# Patient Record
Sex: Male | Born: 1990
Health system: Southern US, Community
[De-identification: ages and names within clinical notes are randomized; demographics above are authoritative.]

## PROBLEM LIST (undated history)

## (undated) DIAGNOSIS — I4819 Other persistent atrial fibrillation: Secondary | ICD-10-CM

## (undated) DIAGNOSIS — F419 Anxiety disorder, unspecified: Secondary | ICD-10-CM

## (undated) HISTORY — DX: Other persistent atrial fibrillation: I48.19

---
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
## 2010-11-14 ENCOUNTER — Encounter: Payer: Self-pay | Admitting: Family Medicine

## 2014-10-27 ENCOUNTER — Emergency Department (HOSPITAL_COMMUNITY): Payer: Self-pay

## 2014-10-27 ENCOUNTER — Emergency Department (HOSPITAL_COMMUNITY)
Admission: EM | Admit: 2014-10-27 | Discharge: 2014-10-27 | Disposition: A | Discharge status: Home / Self Care | Payer: Self-pay | Attending: Emergency Medicine | Admitting: Emergency Medicine

## 2014-10-27 ENCOUNTER — Encounter (HOSPITAL_COMMUNITY): Payer: Self-pay | Admitting: Cardiology

## 2014-10-27 DIAGNOSIS — R000 Tachycardia, unspecified: Secondary | ICD-10-CM | POA: Insufficient documentation

## 2014-10-27 DIAGNOSIS — R0600 Dyspnea, unspecified: Secondary | ICD-10-CM | POA: Insufficient documentation

## 2014-10-27 DIAGNOSIS — R079 Chest pain, unspecified: Secondary | ICD-10-CM

## 2014-10-27 DIAGNOSIS — R42 Dizziness and giddiness: Secondary | ICD-10-CM | POA: Insufficient documentation

## 2014-10-27 DIAGNOSIS — Z87891 Personal history of nicotine dependence: Secondary | ICD-10-CM | POA: Insufficient documentation

## 2014-10-27 DIAGNOSIS — E860 Dehydration: Secondary | ICD-10-CM | POA: Insufficient documentation

## 2014-10-27 LAB — CBC
HCT: 49.5 % (ref 39.0–52.0)
HEMOGLOBIN: 17.4 g/dL — AB (ref 13.0–17.0)
MCH: 31.3 pg (ref 26.0–34.0)
MCHC: 35.2 g/dL (ref 30.0–36.0)
MCV: 89 fL (ref 78.0–100.0)
Platelets: 193 10*3/uL (ref 150–400)
RBC: 5.56 MIL/uL (ref 4.22–5.81)
RDW: 12.1 % (ref 11.5–15.5)
WBC: 8.3 10*3/uL (ref 4.0–10.5)

## 2014-10-27 LAB — BASIC METABOLIC PANEL
ANION GAP: 8 (ref 5–15)
BUN: 17 mg/dL (ref 6–23)
CHLORIDE: 103 meq/L (ref 96–112)
CO2: 27 mmol/L (ref 19–32)
CREATININE: 1.01 mg/dL (ref 0.50–1.35)
Calcium: 9.5 mg/dL (ref 8.4–10.5)
Glucose, Bld: 121 mg/dL — ABNORMAL HIGH (ref 70–99)
Potassium: 3.4 mmol/L — ABNORMAL LOW (ref 3.5–5.1)
Sodium: 138 mmol/L (ref 135–145)

## 2014-10-27 LAB — MAGNESIUM: Magnesium: 1.8 mg/dL (ref 1.5–2.5)

## 2014-10-27 LAB — I-STAT TROPONIN, ED: Troponin i, poc: 0.01 ng/mL (ref 0.00–0.08)

## 2014-10-27 LAB — TSH: TSH: 1.776 u[IU]/mL (ref 0.350–4.500)

## 2014-10-27 LAB — ETHANOL

## 2014-10-27 MED ORDER — SODIUM CHLORIDE 0.9 % IV BOLUS (SEPSIS)
1000.0000 mL | Freq: Once | INTRAVENOUS | Status: AC
  Administered 2014-10-27: 1000 mL via INTRAVENOUS

## 2014-10-27 NOTE — Discharge Instructions (Signed)
As discussed, it is important that you follow up as soon as possible with your physician for continued management of your condition. ° °If you develop any new, or concerning changes in your condition, please return to the emergency department immediately. ° °

## 2014-10-27 NOTE — ED Provider Notes (Signed)
CSN: [id number]     Arrival date & time 10/27/14  1639 History   First MD Initiated Contact with Patient 10/27/14 1714     Chief Complaint  Patient presents with  . Tachycardia     (Consider location/radiation/quality/duration/timing/severity/associated sxs/prior Treatment) HPI Patient presents with concern of palpitations, rapid heart rate. This episode began 3 days ago, since onset has been persistent. Patient is a long history of similar palpitations and tachycardia, though typically episodes resolve spontaneously after several hours or a day. Subsequent has been more pronounced, with more severe palpitations, but with no associated chest pain. There is mild dyspnea and lightheadedness, but no syncope. No neurologic complaints, including no headache, weakness in any extremity. Patient has seen cardiology in the past, has a echocardiogram approximately one year ago, that was abnormal, but seemingly did not include ischemic changes. Since onset no clear alleviating orogastric factors, and no pleuritic or exertional changes.  History reviewed. No pertinent past medical history. History reviewed. No pertinent past surgical history. No family history on file. History  Substance Use Topics  . Smoking status: Former Games developer  . Smokeless tobacco: Not on file  . Alcohol Use: Yes    Review of Systems  Constitutional:       Per HPI, otherwise negative  HENT:       Per HPI, otherwise negative  Respiratory:       Per HPI, otherwise negative  Cardiovascular:       Per HPI, otherwise negative  Gastrointestinal: Negative for vomiting.  Endocrine:       Negative aside from HPI  Genitourinary:       Neg aside from HPI   Musculoskeletal:       Per HPI, otherwise negative  Skin: Negative.   Neurological: Positive for light-headedness. Negative for syncope.      Allergies  Review of patient's allergies indicates no known allergies.  Home Medications   Prior to Admission  medications   Medication Sig Start Date End Date Taking? Authorizing Provider  Multiple Vitamin (MULTIVITAMIN WITH MINERALS) TABS tablet Take 1 tablet by mouth daily.   Yes Historical Provider, MD   BP 109/75 mmHg  Pulse 85  Temp(Src) 98.1 F (36.7 C) (Oral)  Resp 18  SpO2 99% Physical Exam  Constitutional: He is oriented to person, place, and time. He appears well-developed. No distress.  HENT:  Head: Normocephalic and atraumatic.  Eyes: Conjunctivae and EOM are normal.  Cardiovascular: Regular rhythm.  Tachycardia present.   Pulmonary/Chest: Effort normal. No stridor. No respiratory distress.  Abdominal: He exhibits no distension.  Musculoskeletal: He exhibits no edema.  Neurological: He is alert and oriented to person, place, and time.  Skin: Skin is warm and dry.  Psychiatric: He has a normal mood and affect.  Nursing note and vitals reviewed.   ED Course  Procedures (including critical care time) Labs Review Labs Reviewed  CBC - Abnormal; Notable for the following:    Hemoglobin 17.4 (*)    All other components within normal limits  BASIC METABOLIC PANEL - Abnormal; Notable for the following:    Potassium 3.4 (*)    Glucose, Bld 121 (*)    All other components within normal limits  TSH  ETHANOL  MAGNESIUM  T4, FREE  I-STAT TROPOININ, ED    Imaging Review Dg Chest 2 View  10/27/2014   CLINICAL DATA:  Palpitations. Lightheadedness. Shortness of breath. Dizziness for 1 day.  EXAM: CHEST  2 VIEW  COMPARISON:  06/10/2010.  FINDINGS: Normal  sized heart. Clear lungs. Mild central peribronchial thickening. Minimal scoliosis.  IMPRESSION: Mild bronchitic changes with mild progression.   Electronically Signed   By: Gordan Payment M.D.   On: 10/27/2014 17:26     EKG Interpretation   Date/Time:  Monday October 27 2014 16:56:26 EST Ventricular Rate:  153 PR Interval:  112 QRS Duration: 86 QT Interval:  332 QTC Calculation: 530 R Axis:   98 Text Interpretation:  Sinus  tachycardia Rightward axis ST \\T \ Marked  T-wave abnormality, consider inferolateral ischemia Abnormal ECG Sinus  tachycardia Artifact Abnormal ekg Confirmed by Gerhard Munch  MD ([id number])  on 10/27/2014 5:22:34 PM     8:24 PM Patient substantially better in appearance. He denies any cough, substantial dyspnea, or chest discomfort. Heart rate is in the 70s. He will follow-up with cardiology this week. As discussed, it is important that you follow up as soon as possible with your physician for continued management of your condition.  MDM   Final diagnoses:  Chest pain    This patient with a long history of intermittent tachycardia presents with more symptomatically tachycardia, of a longer duration than usual for him. Here the patient is awake and alert, neurologically intact.  Labs are largely reassuring. Initially, the patient had some clinical evidence of dehydration, and given his improvement following 1 L of fluids, this seems like a contributory etiology, given his long history of this, this is unlikely to be the sole cause of his episodes of sinus tachycardia. Patient will follow up with cardiology this week for further evaluation and management.     Gerhard Munch, MD 10/27/14 2024

## 2014-10-27 NOTE — ED Notes (Signed)
Pt reports that over the past couple of days that he has felt like his heart was racing. Also reports SOB, and diaphoresis. States that he has been worked up by a cardiologist for the same and told that he has a abnormality of his heart

## 2014-10-28 LAB — T4, FREE: FREE T4: 1.52 ng/dL (ref 0.80–1.80)

## 2014-10-31 ENCOUNTER — Ambulatory Visit (INDEPENDENT_AMBULATORY_CARE_PROVIDER_SITE_OTHER): Payer: Self-pay | Admitting: Cardiovascular Disease

## 2014-10-31 ENCOUNTER — Encounter: Payer: Self-pay | Admitting: Cardiovascular Disease

## 2014-10-31 VITALS — BP 124/76 | HR 71 | Ht 70.0 in | Wt 130.8 lb

## 2014-10-31 DIAGNOSIS — I471 Supraventricular tachycardia: Secondary | ICD-10-CM

## 2014-10-31 DIAGNOSIS — R0600 Dyspnea, unspecified: Secondary | ICD-10-CM

## 2014-10-31 DIAGNOSIS — R000 Tachycardia, unspecified: Secondary | ICD-10-CM | POA: Insufficient documentation

## 2014-10-31 DIAGNOSIS — R0789 Other chest pain: Secondary | ICD-10-CM

## 2014-10-31 DIAGNOSIS — R079 Chest pain, unspecified: Secondary | ICD-10-CM | POA: Insufficient documentation

## 2014-10-31 MED ORDER — METOPROLOL SUCCINATE ER 25 MG PO TB24: 25.0000 mg | ORAL_TABLET | Freq: Every day | ORAL | Status: DC

## 2014-10-31 NOTE — Assessment & Plan Note (Signed)
Bruce RhodesBetty presents with an episode of tachycardia that occurred several days after he had an acute alcohol binge. I suspect he was still recovering from his alcohol intoxication.  He stated that his heart rate slows very slowly through while in ER. He received IV fluids. I think he still somewhat dehydrated. He is also very anxious. I've encouraged him to eat and drink regularly. We'll have him eat tomato soup and other sibs. We will also have been drinking V8 juice. I'll see him again in one month. We'll give him metoprolol XL 25 mg once a day. He will call if he has any further problems.

## 2014-10-31 NOTE — Progress Notes (Signed)
     Bruce Lopez Date of Birth  [birth year]       [facility]    [place] [facility] [address], [address]  [address], [address] [place], [place]  [id number]   [place], [place]  [id number] [phone]     [phone]   Fax  [phone]     Fax [phone]  Problem List: 1. Tachycardia   History of Present Illness:  Bruce Lopez is a 23 with hx of tachypalpitations.  He has been seen for the past several years by Dr. Jacinto HalimGanji. He has episodes of pre-syncope.  These episodes last for 30 minutes - 1 hour   He was seen in the emergency department on January 4. He had sinus tachycardia versus SVT. Heart rate was 156.  Received IV NS His HR gradually slowed and was normal.  Eats and drinks regularly Drinks on occasion.  He drank quite a lot on  New years eve.  Felt poorly for the next several days and then eventually went to the ER on Monday , Jan. 4.  Feels better today. Still has some palpitations.  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  Used to work as a MetallurgistYouth counselor. Drinks on occasion Non smoker Fhx:  No cardiac hx.  No street drugs.    Current Outpatient Prescriptions on File Prior to Visit  Medication Sig Dispense Refill  . Multiple Vitamin (MULTIVITAMIN WITH MINERALS) TABS tablet Take 1 tablet by mouth daily.     No current facility-administered medications on file prior to visit.    No Known Allergies  No past medical history on file.  No past surgical history on file.  History  Smoking status  . Former Smoker  Smokeless tobacco  . Not on file    History  Alcohol Use  . Yes    No family history on file.  Reviw of Systems:  Reviewed in the HPI.  All other systems are negative.  Physical Exam: Blood pressure 124/76, pulse 71, height 5\' 10"  (1.778 m), weight 130 lb 12.8 oz (59.33 kg), SpO2 98 %. Wt Readings from Last 3 Encounters:  10/31/14 130 lb 12.8 oz (59.33 kg)     General: Well developed, well nourished, in no acute distress.  Head:  Normocephalic, atraumatic, sclera non-icteric, mucus membranes are moist,   Neck: Supple. Carotids are 2 + without bruits. No JVD   Lungs: Clear   Heart: RR, hyperdynamic  Abdomen: Soft, non-tender, non-distended with normal bowel sounds.  Msk:  Strength and tone are normal   Extremities: No clubbing or cyanosis. No edema.  Distal pedal pulses are 2+ and equal    Neuro: CN II - XII intact.  Alert and oriented X 3.   Psych:  Normal   ECG: Jan. 4, 2016:  Sinus tach at 156 Jan. 8, 2016:  NSR at 91, RAD,    Assessment / Plan:

## 2014-10-31 NOTE — Patient Instructions (Signed)
Your physician has recommended you make the following change in your medication:  START Toprol XL 25 mg once daily  Increase your intake of fluids (water with electrolyte tabs like Nun tablets, or gatorade) , protein ( hard boiled eggs, chicken, fish) , and a electrolytes ( V-8 juice, salt, potassium chloride  which is sold as No-Salt  Your physician has requested that you have an echocardiogram. Echocardiography is a painless test that uses sound waves to create images of your heart. It provides your doctor with information about the size and shape of your heart and how well your heart's chambers and valves are working. This procedure takes approximately one hour. There are no restrictions for this procedure.  Your physician recommends that you schedule a follow-up appointment in: 1 month with Dr. Elease HashimotoNahser.

## 2014-11-01 NOTE — Assessment & Plan Note (Signed)
He has atypical CP, Sounds like he may have GERD  Following

## 2014-11-03 ENCOUNTER — Telehealth: Payer: Self-pay | Admitting: Cardiovascular Disease

## 2014-11-03 NOTE — Telephone Encounter (Signed)
Pt signed ROI at front desk at arrival for appointment [facility] & labs a staff member from Dr.ganji office brought these records  Up to our office and these were taken to Michelle/Dr Nahser 10/31/14/km

## 2014-11-04 ENCOUNTER — Ambulatory Visit (HOSPITAL_COMMUNITY)
Admission: RE | Admit: 2014-11-04 | Discharge: 2014-11-04 | Disposition: A | Discharge status: Home / Self Care | Payer: Self-pay | Source: Ambulatory Visit | Attending: Cardiovascular Disease | Admitting: Cardiovascular Disease

## 2014-11-04 ENCOUNTER — Other Ambulatory Visit (HOSPITAL_COMMUNITY): Payer: Self-pay | Admitting: Cardiovascular Disease

## 2014-11-04 DIAGNOSIS — R002 Palpitations: Secondary | ICD-10-CM | POA: Insufficient documentation

## 2014-11-04 DIAGNOSIS — I471 Supraventricular tachycardia: Secondary | ICD-10-CM

## 2014-11-04 DIAGNOSIS — R0600 Dyspnea, unspecified: Secondary | ICD-10-CM

## 2014-11-04 DIAGNOSIS — R000 Tachycardia, unspecified: Secondary | ICD-10-CM

## 2014-11-04 NOTE — Progress Notes (Signed)
2D Echo Performed 11/04/2014    Ellice Boultinghouse, RCS  

## 2014-11-05 ENCOUNTER — Telehealth: Payer: Self-pay | Admitting: Cardiovascular Disease

## 2014-11-05 NOTE — Telephone Encounter (Signed)
I agree with the note by Michelle Swinyer, RN  

## 2014-11-05 NOTE — Telephone Encounter (Signed)
Spoke with patient and reviewed echo results; patient verbalized understanding.  Patient denies history of rheumatic fever - states he remembers something of this nature being questioned when he had previous echocardiogram and his mother states he did not have this in his history.  Patient reports pulse is running in the high 50's; states is this an effect of the Toprol XL.  I advised patient that a slower heart rate is an effect of Toprol and questioned whether he has experienced any light-headedness or dizziness; patient denies.  I advised patient to continue to monitor his HR and BP and to call back if heart rate drops below 50 bpm and/or if patient develops dizziness, light-headedness or any other problem of concern to him.  Patient states he has been feeling better every day since his appointment last week.  I advised him to call if needed prior to follow-up in 3 weeks.

## 2014-11-05 NOTE — Telephone Encounter (Signed)
[facility], pls call [phone]

## 2014-11-17 ENCOUNTER — Ambulatory Visit: Payer: Self-pay | Admitting: Cardiology

## 2014-11-18 ENCOUNTER — Ambulatory Visit: Payer: Self-pay | Admitting: Cardiology

## 2014-12-10 ENCOUNTER — Encounter: Payer: Self-pay | Admitting: Cardiovascular Disease

## 2014-12-10 ENCOUNTER — Ambulatory Visit (INDEPENDENT_AMBULATORY_CARE_PROVIDER_SITE_OTHER): Payer: Self-pay | Admitting: Cardiovascular Disease

## 2014-12-10 DIAGNOSIS — I471 Supraventricular tachycardia: Secondary | ICD-10-CM

## 2014-12-10 DIAGNOSIS — R0789 Other chest pain: Secondary | ICD-10-CM

## 2014-12-10 DIAGNOSIS — R0600 Dyspnea, unspecified: Secondary | ICD-10-CM

## 2014-12-10 DIAGNOSIS — R000 Tachycardia, unspecified: Secondary | ICD-10-CM

## 2014-12-10 MED ORDER — METOPROLOL SUCCINATE ER 25 MG PO TB24: 12.5000 mg | ORAL_TABLET | Freq: Every day | ORAL | Status: DC

## 2014-12-10 NOTE — Patient Instructions (Signed)
Your physician has recommended you make the following change in your medication:  DECREASE Toprol to 12.5 mg once daily  Your physician wants you to follow-up in: 1 year with Dr. Elease HashimotoNahser.  You will receive a reminder letter in the mail two months in advance. If you don't receive a letter, please call our office to schedule the follow-up appointment.

## 2014-12-10 NOTE — Progress Notes (Signed)
Cardiology Office Note   Date:  12/10/2014   ID:  Bruce CuretRandy A Lopez, DOB January 22, 1991, MRN [id number]  PCP:  No PCP Per Patient  Cardiologist:   Vesta MixerNahser, Philip J, MD   Chief Complaint  Patient presents with  . Tachycardia   1. Tachycardia   History of Present Illness:  Bruce Lopez is a 23 with hx of tachypalpitations. He has been seen for the past several years by Dr. Jacinto HalimGanji. He has episodes of pre-syncope.  These episodes last for 30 minutes - 1 hour   He was seen in the emergency department on January 4. He had sinus tachycardia versus SVT. Heart rate was 156. Received IV NS His HR gradually slowed and was normal.  Eats and drinks regularly Drinks on occasion. He drank quite a lot on New years eve. Felt poorly for the next several days and then eventually went to the ER on Monday , Jan. 4.  Feels better today. Still has some palpitations.  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 Used to work as a MetallurgistYouth counselor. Drinks on occasion Non smoker Fhx: No cardiac hx.  No street drugs.   Feb. 17, 2016:   Bruce Lopez is a 24 y.o. male who presents for  Follow-up of his tachycardia. I saw him several months ago. He been drinking lots of alcohol. I advised him to abstain from drinking so much alcohol and we started him on Toprol-XL 25 mg a day.  It took about a week for him to feel more like his usual self.   He's feeling much better since that time.   He has had a few episodes of dizziness when standing     No past medical history on file.  No past surgical history on file.   Current Outpatient Prescriptions  Medication Sig Dispense Refill  . metoprolol succinate (TOPROL XL) 25 MG 24 hr tablet Take 1 tablet (25 mg total) by mouth daily. 31 tablet 11  . Multiple Vitamin (MULTIVITAMIN WITH MINERALS) TABS tablet Take 1 tablet by mouth daily. EVERY OTHER DAY     No current facility-administered medications for this visit.    Allergies:   Review of patient's allergies indicates no known  allergies.    Social History:  The patient  reports that he has quit smoking. He does not have any smokeless tobacco history on file. He reports that he drinks alcohol. He reports that he does not use illicit drugs.   Family History:  The patient's family history is not on file.    ROS:  Please see the history of present illness.    Review of Systems: Constitutional:  denies fever, chills, diaphoresis, appetite change and fatigue.  HEENT: denies photophobia, eye pain, redness, hearing loss, ear pain, congestion, sore throat, rhinorrhea, sneezing, neck pain, neck stiffness and tinnitus.  Respiratory: denies SOB, DOE, cough, chest tightness, and wheezing.  Cardiovascular: denies chest pain, palpitations and leg swelling.  Gastrointestinal: denies nausea, vomiting, abdominal pain, diarrhea, constipation, blood in stool.  Genitourinary: denies dysuria, urgency, frequency, hematuria, flank pain and difficulty urinating.  Musculoskeletal: denies  myalgias, back pain, joint swelling, arthralgias and gait problem.   Skin: denies pallor, rash and wound.  Neurological: denies dizziness, seizures, syncope, weakness, light-headedness, numbness and headaches.   Hematological: denies adenopathy, easy bruising, personal or family bleeding history.  Psychiatric/ Behavioral: denies suicidal ideation, mood changes, confusion, nervousness, sleep disturbance and agitation.       All other systems are reviewed and negative.    PHYSICAL  EXAM: VS:  BP 94/60 mmHg  Pulse 57  Ht  (1.778 m)  Wt 135 lb 6.4 oz (61.417 kg)  BMI 19.43 kg/m2  SpO2 98% , BMI Body mass index is 19.43 kg/(m^2). GEN: Well nourished, well developed, in no acute distress HEENT: normal Neck: no JVD, carotid bruits, or masses Cardiac: RRR; soft systolic 1-2  / 6 systolic murmurs,no edema  Respiratory:  clear to auscultation bilaterally, normal work of breathing GI: soft, nontender, nondistended, + BS MS: no deformity or  atrophy Skin: warm and dry, no rash Neuro:  Strength and sensation are intact Psych: normal   EKG:  EKG is not ordered today.    Recent Labs: 10/27/2014: BUN 17; Creatinine 1.01; Hemoglobin 17.4*; Magnesium 1.8; Platelets 193; Potassium 3.4*; Sodium 138; TSH 1.776    Lipid Panel No results found for: CHOL, TRIG, HDL, CHOLHDL, VLDL, LDLCALC, LDLDIRECT    Wt Readings from Last 3 Encounters:  12/10/14 135 lb 6.4 oz (61.417 kg)  10/31/14 130 lb 12.8 oz (59.33 kg)      Other studies Reviewed: Additional studies/ records that were reviewed today include: . Review of the above records demonstrates:    ASSESSMENT AND PLAN:  1.   Sinus tachycardia: The patient is doing quite a bit better. He's no longer having any episodes of sinus tachycardia and he thinks that his palpitations are much better controlled on the low-dose Toprol. I think most of this is because he has stopped drinking excessive amounts of alcohol.   I've continued to caution him about it drinking excessive amounts about call. He would like to try to cut his Toprol-XL to one half tablet a day. I'll see him again in one year for follow-up visit.   Current medicines are reviewed at length with the patient today.  The patient does not have concerns regarding medicines.  The following changes have been made:  Decrease Toprol  To 12. 5  Mg a day    Disposition:   FU with 1 year    Signed, Nahser, Deloris Ping, MD  12/10/2014 4:08 PM    [facility] [address] [place], [place], [place]  [address] Phone: [phone]; Fax: [phone]

## 2015-04-15 NOTE — Addendum Note (Signed)
Addended by: Levi Aland on: 04/15/2015 05:01 PM   Modules accepted: Orders

## 2016-06-26 ENCOUNTER — Emergency Department (HOSPITAL_COMMUNITY)
Admission: EM | Admit: 2016-06-26 | Discharge: 2016-06-26 | Disposition: A | Discharge status: Home / Self Care | Payer: BLUE CROSS/BLUE SHIELD | Attending: Emergency Medicine | Admitting: Emergency Medicine

## 2016-06-26 ENCOUNTER — Emergency Department (HOSPITAL_COMMUNITY): Payer: BLUE CROSS/BLUE SHIELD

## 2016-06-26 ENCOUNTER — Encounter (HOSPITAL_COMMUNITY): Payer: Self-pay | Admitting: Nurse Practitioner

## 2016-06-26 DIAGNOSIS — R0602 Shortness of breath: Secondary | ICD-10-CM | POA: Diagnosis not present

## 2016-06-26 DIAGNOSIS — Z87891 Personal history of nicotine dependence: Secondary | ICD-10-CM | POA: Insufficient documentation

## 2016-06-26 DIAGNOSIS — R42 Dizziness and giddiness: Secondary | ICD-10-CM

## 2016-06-26 DIAGNOSIS — R200 Anesthesia of skin: Secondary | ICD-10-CM | POA: Diagnosis not present

## 2016-06-26 LAB — I-STAT TROPONIN, ED: Troponin i, poc: 0 ng/mL (ref 0.00–0.08)

## 2016-06-26 LAB — BASIC METABOLIC PANEL
Anion gap: 8 (ref 5–15)
BUN: 17 mg/dL (ref 6–20)
CALCIUM: 10.1 mg/dL (ref 8.9–10.3)
CHLORIDE: 107 mmol/L (ref 101–111)
CO2: 23 mmol/L (ref 22–32)
CREATININE: 0.94 mg/dL (ref 0.61–1.24)
GFR calc Af Amer: >60 mL/min (ref >60)
GFR calc non Af Amer: >60 mL/min (ref >60)
GLUCOSE: 129 mg/dL — AB (ref 65–99)
Potassium: 3 mmol/L — ABNORMAL LOW (ref 3.5–5.1)
Sodium: 138 mmol/L (ref 135–145)

## 2016-06-26 LAB — CBC
HEMATOCRIT: 48.7 % (ref 39.0–52.0)
Hemoglobin: 16.5 g/dL (ref 13.0–17.0)
MCH: 31.3 pg (ref 26.0–34.0)
MCHC: 33.9 g/dL (ref 30.0–36.0)
MCV: 92.4 fL (ref 78.0–100.0)
Platelets: 156 10*3/uL (ref 150–400)
RBC: 5.27 MIL/uL (ref 4.22–5.81)
RDW: 11.8 % (ref 11.5–15.5)
WBC: 7.2 10*3/uL (ref 4.0–10.5)

## 2016-06-26 LAB — D-DIMER, QUANTITATIVE: D-Dimer, Quant: <0.27 ug/mL-FEU (ref 0.00–0.50)

## 2016-06-26 LAB — TSH: TSH: 0.929 u[IU]/mL (ref 0.350–4.500)

## 2016-06-26 MED ORDER — LORAZEPAM 2 MG/ML IJ SOLN
1.0000 mg | Freq: Once | INTRAMUSCULAR | Status: AC
  Administered 2016-06-26: 1 mg via INTRAVENOUS
  Filled 2016-06-26: qty 1

## 2016-06-26 MED ORDER — SODIUM CHLORIDE 0.9 % IV BOLUS (SEPSIS)
1000.0000 mL | Freq: Once | INTRAVENOUS | Status: AC
  Administered 2016-06-26: 1000 mL via INTRAVENOUS

## 2016-06-26 NOTE — ED Triage Notes (Addendum)
He c/o several week history of intermittent SOB, dizziness, numbness in bilateral legs. Symptoms increased with exertion, relieved by nothing. He denies pain, fevers, n/v. He reports cough.he is alert and breathing easily

## 2016-06-26 NOTE — Discharge Instructions (Signed)
Your chest xray, blood work, and lung exam today did not show any acute findings. Please follow up with your family doctor if you are continuing to have shortness of breath.

## 2016-06-26 NOTE — ED Provider Notes (Signed)
[facility]-EMER[facility]ENCY DEPT Provider Note   CSN: [id number] Arrival date & time: 06/26/16  1556     History   Chief Complaint Chief Complaint  Patient presents with  . Shortness of Breath    HPI Bruce Lopez is a 25 y.o. male who presents with SOB, lightheadedness, and numbness in bilateral legs. PMH significant for palpitations and anxiety. Over the past week he reports increasing SOB especially with exertion and exercise. He has not eaten or drank anything today. Today he started to have lightheadedness when walking around and intermittent numbness in his hands and calves. He SOB has gotten worse today therefore came to the ED for further evaluation. He denies fever, chills, syncope, URI symptoms, chest pain, cough, hemoptysis, wheezing, leg swelling, abdominal pain, N/V, recent surgery or travel, exogenous hormone use, hx of DVT/PE. He was seen in the ED on 10/27/2014 for palpitations vs SVT. Was given fluids and was d/c'ed with cardiology follow up. He saw Dr. Elease Hashimoto who put him on low dose Toprol and believed that his symptoms were most likely due to dehydration after heavily drinking alcohol on New Years. Echo on Jan 2016 showed EF 55-60% with mild thickening of the mitral valve consistent with rheumatic heart disease and mild MR. He has been taking Toprol prn for palpitations for the past 6 months. Last dose was ~1.5 weeks ago. He states he is feeling anxious presently due to being in the hospital however has not felt particularly anxious over the past week. Reports he does not smoke.  HPI  Past Medical History:  Diagnosis Date  . Palpitations     Patient Active Problem List   Diagnosis Date Noted  . Sinus tachycardia (HCC) 10/31/2014  . Chest pain 10/31/2014    History reviewed. No pertinent surgical history.     Home Medications    Prior to Admission medications   Medication Sig Start Date End Date Taking? Authorizing Provider  metoprolol succinate (TOPROL XL) 25 MG 24 hr  tablet Take 0.5 tablets (12.5 mg total) by mouth daily. 12/10/14   Vesta Mixer, MD  Multiple Vitamin (MULTIVITAMIN WITH MINERALS) TABS tablet Take 1 tablet by mouth daily. EVERY OTHER DAY    Historical Provider, MD    Family History History reviewed. No pertinent family history.  Social History Social History  Substance Use Topics  . Smoking status: Former Games developer  . Smokeless tobacco: Never Used  . Alcohol use Yes     Allergies   Review of patient's allergies indicates no known allergies.   Review of Systems Review of Systems  Constitutional: Negative for appetite change and fever.  Respiratory: Positive for chest tightness and shortness of breath. Negative for cough, wheezing and stridor.        Throat tightness  Cardiovascular: Negative for chest pain and palpitations.  Gastrointestinal: Negative for abdominal pain, nausea and vomiting.  Musculoskeletal: Negative for back pain and neck pain.  Neurological: Positive for light-headedness and numbness. Negative for dizziness, syncope, weakness and headaches.  Psychiatric/Behavioral: The patient is nervous/anxious.   All other systems reviewed and are negative.    Physical Exam Updated Vital Signs BP 124/89   Pulse 95   Temp 98.9 F (37.2 C) (Oral)   Resp 18   SpO2 100%   Physical Exam  Constitutional: He is oriented to person, place, and time. He appears well-developed and well-nourished. No distress.  Thin, tremulous male. Mild diaphoresis.  HENT:  Head: Normocephalic and atraumatic.  Eyes: Conjunctivae and EOM are normal.  Pupils are equal, round, and reactive to light. Right eye exhibits no discharge. Left eye exhibits no discharge. No scleral icterus.  Neck: Normal range of motion. Neck supple.  No midline tenderness  Cardiovascular: Normal rate and regular rhythm.  Exam reveals no gallop and no friction rub.   No murmur heard. Tachycardia with positional changes. HR at baseline when at rest is ~80-90    Pulmonary/Chest: Effort normal and breath sounds normal. No respiratory distress. He has no wheezes. He has no rales. He exhibits no tenderness.  Abdominal: Soft. He exhibits no distension. There is no tenderness.  Musculoskeletal: He exhibits no edema.  No midline T or L spine tenderness  Neurological: He is alert and oriented to person, place, and time.  Mental Status:  Alert, oriented, thought content appropriate, able to give a coherent history. Speech fluent without evidence of aphasia. Able to follow 2 step commands without difficulty.  Cranial Nerves:  II:  Peripheral visual fields grossly normal, pupils equal, round, reactive to light III,IV, VI: ptosis not present, extra-ocular motions intact bilaterally  V,VII: smile symmetric, facial light touch sensation equal VIII: hearing grossly normal to voice  X: uvula elevates symmetrically  XI: bilateral shoulder shrug symmetric and strong XII: midline tongue extension without fassiculations Motor:  Normal tone. 5/5 in upper and lower extremities bilaterally including strong and equal grip strength and dorsiflexion/plantar flexion Sensory: Pinprick and light touch normal in all extremities.  Deep Tendon Reflexes: 2+ and symmetric in the biceps and patella Cerebellar: normal finger-to-nose with bilateral upper extremities Gait: normal gait and balance CV: distal pulses palpable throughout    Skin: Skin is warm. He is diaphoretic.  Psychiatric: His speech is normal and behavior is normal. Thought content normal. His mood appears anxious.  Nursing note and vitals reviewed.   ED Treatments / Results  Labs (all labs ordered are listed, but only abnormal results are displayed) Labs Reviewed  BASIC METABOLIC PANEL - Abnormal; Notable for the following:       Result Value   Potassium 3.0 (*)    Glucose, Bld 129 (*)    All other components within normal limits  CBC  TSH  D-DIMER, QUANTITATIVE (NOT AT [facility])  Rosezena Sensor, ED     EKG  EKG Interpretation  Date/Time:  Sunday June 26 2016 16:00:22 EDT Ventricular Rate:  121 PR Interval:  122 QRS Duration: 94 QT Interval:  336 QTC Calculation: 477 R Axis:   92 Text Interpretation:  Sinus tachycardia Biatrial enlargement Rightward axis Pulmonary disease pattern ST & T wave abnormality, consider inferolateral ischemia Abnormal ECG No significant change since last tracing Confirmed by [facility] MD, JULIE ([id number]) on 06/26/2016 5:06:48 PM       Radiology Dg Chest 2 View  Result Date: 06/26/2016 CLINICAL DATA:  Shortness of breath with exertion for over week worse today EXAM: CHEST  2 VIEW COMPARISON:  10/27/2014 FINDINGS: Normal heart size, mediastinal contours, and pulmonary vascularity. Minimal chronic bronchitic changes. Lungs otherwise clear. No pleural effusion or pneumothorax. No acute bony abnormalities. IMPRESSION: Minimal chronic bronchitic changes without acute infiltrate. Electronically Signed   By: Ulyses Southward M.D.   On: 06/26/2016 16:19    Procedures Procedures (including critical care time)  Medications Ordered in ED Medications  sodium chloride 0.9 % bolus 1,000 mL (0 mLs Intravenous Stopped 06/26/16 1807)  LORazepam (ATIVAN) injection 1 mg (1 mg Intravenous Given 06/26/16 1718)     Initial Impression / Assessment and Plan / ED Course  I have  reviewed the triage vital signs and the nursing notes.  Pertinent labs & imaging results that were available during my care of the patient were reviewed by me and considered in my medical decision making (see chart for details).  Clinical Course   25 year old male presents with shortness of breath, numbness, lightheadedness. Symptoms are consistent with anxiety with possible dehydration component. He is initially tachycardic into the 120s with positional changes on arrival however at rest he is mostly in 80-90 range. Patient is afebrile, not tachypneic, normotensive, and not hypoxic. Orthostatic BP checked  was normal however pulse was not obtained. CBC is unremarkable. BMP remarkable for mild hypokalemia and mild hyperglycemia. Initial troponin was obtained in triage and was 0. Chest x-ray remarkable for mild chronic bronchitic changes without acute infiltrate. IV fluids and Ativan given.  6:40PM: On recheck patient feels more comfortable and less SOB. TSH and D-dimer are normal. Will d/c with PCP follow up. Patient and mother verbalize understanding. Patient is NAD, non-toxic, with stable VS. Patient is informed of clinical course, understands medical decision making process, and agrees with plan. Opportunity for questions provided and all questions answered. Return precautions given.  Final Clinical Impressions(s) / ED Diagnoses   Final diagnoses:  SOB (shortness of breath)  Extremity numbness  Lightheadedness    New Prescriptions New Prescriptions   No medications on file     Bethel BornKelly Marie Shantanique Hodo, PA-C 06/26/16 1928    Jacalyn LefevreJulie Haviland, MD 06/26/16 2328

## 2016-06-28 ENCOUNTER — Ambulatory Visit (INDEPENDENT_AMBULATORY_CARE_PROVIDER_SITE_OTHER): Payer: Self-pay | Admitting: Physician Assistant

## 2016-06-28 ENCOUNTER — Encounter: Payer: Self-pay | Admitting: Physician Assistant

## 2016-06-28 VITALS — BP 108/70 | HR 59 | Temp 98.6°F | Ht 69.0 in | Wt 131.6 lb

## 2016-06-28 DIAGNOSIS — R0602 Shortness of breath: Secondary | ICD-10-CM

## 2016-06-28 DIAGNOSIS — J302 Other seasonal allergic rhinitis: Secondary | ICD-10-CM | POA: Diagnosis not present

## 2016-06-28 NOTE — Patient Instructions (Addendum)
Zyrtec or claritin or allegra to help with the mucus and for the allergies Do not get the one with the D -   You can try Flonase Over the counter which is a nose spray that may  help   IF you received an x-ray today, you will receive an invoice from [facility]. Please contact [facility] at [phone] with questions or concerns regarding your invoice.   IF you received labwork today, you will receive an invoice from [facility]. Please contact [facility] at [phone] with questions or concerns regarding your invoice.   Our billing staff will not be able to assist you with questions regarding bills from these companies.  You will be contacted with the lab results as soon as they are available. The fastest way to get your results is to activate your My Chart account. Instructions are located on the last page of this paperwork. If you have not heard from us regarding the results in 2 weeks, please contact this office.

## 2016-06-28 NOTE — Progress Notes (Signed)
   [name] A Caterino  MRN: [id number] DOB: 06/22/1991  Subjective:  Pt presents to clinic for hospital f/u from 9/3 visit where he was diagnosed with possible anxiety.  He went to the ED due to SOB that has worsened since then he feels like it is about the same - it is slightly worse with activity - he is having no SOB - he does have some nasal congestion that he thinks might be allergies but no real cold symptoms.  He has never had asthma and never had wheezing.  He feels like he has a take a deeper breath than normal to feel that he is breathing normal.  He can do everything he wants to do -- he is not having trouble talking without problems.  [facility] - works a lot outside  smoker but less than 1/2 pack per day  He has h/o anxiety but he has always had it with irregular HR - he does not feel like he is more stressed than normal andt he thinks it is possible he is having some of that now.  Review of Systems  Respiratory: Positive for shortness of breath. Negative for cough, chest tightness and wheezing.   Cardiovascular: Negative for chest pain and palpitations.  Psychiatric/Behavioral: The patient is nervous/anxious (stress).     Patient Active Problem List   Diagnosis Date Noted  . Sinus tachycardia (HCC) 10/31/2014  . Chest pain 10/31/2014    Current Outpatient Prescriptions on File Prior to Visit  Medication Sig Dispense Refill  . metoprolol succinate (TOPROL XL) 25 MG 24 hr tablet Take 0.5 tablets (12.5 mg total) by mouth daily. 31 tablet 11  . Multiple Vitamin (MULTIVITAMIN WITH MINERALS) TABS tablet Take 1 tablet by mouth daily. EVERY OTHER DAY     No current facility-administered medications on file prior to visit.     No Known Allergies  Pt patients past, family and social history were reviewed and updated.  Objective:  BP 108/70 (BP Location: Right Arm, Patient Position: Sitting, Cuff Size: Normal)   Pulse (!)  59   Temp 98.6 F (37 C) (Oral)   Ht 5\' 9"  (1.753 m)   Wt 131 lb 9.6 oz (59.7 kg)   SpO2 99%   BMI 19.43 kg/m   Physical Exam  Constitutional: He is oriented to person, place, and time and well-developed, well-nourished, and in no distress.  HENT:  Head: Normocephalic and atraumatic.  Right Ear: External ear normal.  Left Ear: External ear normal.  Eyes: Conjunctivae are normal.  Neck: Normal range of motion.  Cardiovascular: Normal rate, regular rhythm and normal heart sounds.   No murmur heard. Pulmonary/Chest: Effort normal and breath sounds normal. He has no wheezes.  Neurological: He is alert and oriented to person, place, and time. Gait normal.  Skin: Skin is warm and dry.  Psychiatric: Mood, memory, affect and judgment normal.    Assessment and Plan :  SOB (shortness of breath)  Seasonal allergies   Expect this to not be cardiac or lung related -- d/w pt that this may be multifactorial in regards to anxiety and possible nasal congestion with allergies.  He will treat his allergies with OTC allergy medications and he will continue to monitor his stress and stressful situations and how they related to this SOB - he agrees and understands the plan.  Benny LennertSarah Jamacia Jester PA-C  [facility] 06/30/2016 1:58 PM

## 2017-05-26 ENCOUNTER — Telehealth: Payer: Self-pay | Admitting: Cardiovascular Disease

## 2017-05-26 ENCOUNTER — Telehealth: Payer: Self-pay | Admitting: Cardiology

## 2017-05-26 NOTE — Telephone Encounter (Signed)
New message   Pt states that he has started having palpitations again, and he has taken metoprolol and it hasnt really helped.   Patient c/o Palpitations:  High priority if patient c/o lightheadedness and shortness of breath.  1. How long have you been having palpitations? About a week  2. Are you currently experiencing lightheadedness and shortness of breath? Some SOB-especially with strenuous activities  3. Have you checked your BP and heart rate? (document readings) no   4. Are you experiencing any other symptoms? no

## 2017-05-26 NOTE — Telephone Encounter (Signed)
Called patient to discuss.  He plans to go to urgent care this evening for rapid heart beat, irregular feeing much of the time over past 1-2 weeks.  This worsens with exertional activities and he can become SOB during these times.  He reports has been staying well hydrated.  Has cut out caffeine and alcohol.  Used an albuterol inhaler a day before this occurred, but not since.  No new medications.  Unable to check BP and HR at home, part of why he wants to go to urgent care.    I have asked him to call back on Monday to update how he is doing and what happened at his urgent care appointment and at that time if needed we will schedule him back in cardiology.  Pt is in agreement with this plan.

## 2017-05-26 NOTE — Telephone Encounter (Signed)
I received a call [facility] [place] who states that he is seeing the patient for palpitations and an EKG showed atrial fibrillation at 72 bpm. The patient is stable. He has been taking his Toprol only as needed for fast heart beat. Homero FellersFrank does not think the patient needs to go to the hospital since his rate is controlled and he is stable with no chest pain or shortness of breath. He wanted some direction for treatment. I advised to have pt take his Toprol 12.5 mg daily until seen at our office and start anticoagulation with Xarelto 20 mg daily. Kidney function has been normal in the past. I will send a message to triage to bring pt in on Monday to be seen.  I advised him to instruct pt to proceed to the hospital if he becomes symptomatic. He agrees.   Berton BonJanine Decarlo Rivet, AGNP-C [facility] 05/26/2017  7:58 PM

## 2017-05-27 NOTE — Telephone Encounter (Signed)
Agree with note by Lizabeth LeydenNina Hammond, NP  Will add scheduled metoprolol and Xarelto  Will have him seen in the office in a week or so

## 2017-05-29 ENCOUNTER — Telehealth: Payer: Self-pay | Admitting: *Deleted

## 2017-05-29 ENCOUNTER — Encounter (HOSPITAL_COMMUNITY): Payer: Self-pay | Admitting: Nurse Practitioner

## 2017-05-29 ENCOUNTER — Ambulatory Visit (HOSPITAL_COMMUNITY)
Admission: RE | Admit: 2017-05-29 | Discharge: 2017-05-29 | Disposition: A | Discharge status: Home / Self Care | Payer: BLUE CROSS/BLUE SHIELD | Source: Ambulatory Visit | Attending: Nurse Practitioner | Admitting: Nurse Practitioner

## 2017-05-29 VITALS — BP 112/74 | HR 95 | Ht 69.0 in | Wt 133.4 lb

## 2017-05-29 DIAGNOSIS — I481 Persistent atrial fibrillation: Secondary | ICD-10-CM | POA: Diagnosis not present

## 2017-05-29 DIAGNOSIS — I480 Paroxysmal atrial fibrillation: Secondary | ICD-10-CM

## 2017-05-29 DIAGNOSIS — F17200 Nicotine dependence, unspecified, uncomplicated: Secondary | ICD-10-CM | POA: Diagnosis not present

## 2017-05-29 DIAGNOSIS — Z79899 Other long term (current) drug therapy: Secondary | ICD-10-CM | POA: Diagnosis not present

## 2017-05-29 DIAGNOSIS — Z7901 Long term (current) use of anticoagulants: Secondary | ICD-10-CM | POA: Diagnosis not present

## 2017-05-29 DIAGNOSIS — R002 Palpitations: Secondary | ICD-10-CM | POA: Diagnosis present

## 2017-05-29 LAB — CBC
HCT: 49.6 % (ref 39.0–52.0)
Hemoglobin: 16.9 g/dL (ref 13.0–17.0)
MCH: 31.2 pg (ref 26.0–34.0)
MCHC: 34.1 g/dL (ref 30.0–36.0)
MCV: 91.5 fL (ref 78.0–100.0)
PLATELETS: 162 10*3/uL (ref 150–400)
RBC: 5.42 MIL/uL (ref 4.22–5.81)
RDW: 12.4 % (ref 11.5–15.5)
WBC: 5.7 10*3/uL (ref 4.0–10.5)

## 2017-05-29 LAB — BASIC METABOLIC PANEL
Anion gap: 7 (ref 5–15)
BUN: 20 mg/dL (ref 6–20)
CALCIUM: 9.9 mg/dL (ref 8.9–10.3)
CO2: 28 mmol/L (ref 22–32)
CREATININE: 0.96 mg/dL (ref 0.61–1.24)
Chloride: 104 mmol/L (ref 101–111)
GFR calc Af Amer: >60 mL/min (ref >60)
GLUCOSE: 90 mg/dL (ref 65–99)
POTASSIUM: 4.7 mmol/L (ref 3.5–5.1)
Sodium: 139 mmol/L (ref 135–145)

## 2017-05-29 LAB — TSH: TSH: 1.379 u[IU]/mL (ref 0.350–4.500)

## 2017-05-29 MED ORDER — RIVAROXABAN 20 MG PO TABS: 20.0000 mg | ORAL_TABLET | Freq: Every day | ORAL | Status: DC

## 2017-05-29 NOTE — Telephone Encounter (Signed)
Pt has been scheduled in At[facility] Fibrillation Clinic today at 1:30 PM, code is 7001 for August.  LMTCB for pt to let him know about this appointment.

## 2017-05-29 NOTE — Telephone Encounter (Signed)
Pt aware of location and appointment time in A Fib Clinic today

## 2017-05-29 NOTE — Telephone Encounter (Signed)
F/u call:  Patient called to back to confirm appt at the AFIB Clinic and states that if you have any additional information, he will have his phone beside him.

## 2017-05-29 NOTE — Progress Notes (Signed)
Primary Care Physician: Patient, No Pcp Per Referring Physician: Dr. Kasandra Lopez is a 26 y.o. male with a h/o SVT, followed by Dr. Elease Hashimoto, that was seen in an urgent care clinic for palpitations, found to be in rate controlled afib. He denies any use of caffeine, alcohol, drugs, no sleep apnea. Possibly has not slept as well for the last couple of weeks. He felt he was out of rhythm for a week before he went to urgent care. He has EKG with him that very clearly documents afib. He is in rate controlled afib today as well, HR in the 90's. Did not take BB today yet. Works as a Metallurgist. Did use  inhaler the night before he noted afib.  Today, he denies symptoms of palpitations, chest pain, shortness of breath, orthopnea, PND, lower extremity edema, dizziness, presyncope, syncope, or neurologic sequela. The patient is tolerating medications without difficulties and is otherwise without complaint today.   Past Medical History:  Diagnosis Date  . Palpitations    No past surgical history on file.  Current Outpatient Prescriptions  Medication Sig Dispense Refill  . Lysine HCl (L-FORMULA LYSINE HCL) 500 MG TABS Take 1 tablet by mouth daily.    . metoprolol succinate (TOPROL XL) 25 MG 24 hr tablet Take 0.5 tablets (12.5 mg total) by mouth daily. 31 tablet 11  . Multiple Vitamin (MULTIVITAMIN WITH MINERALS) TABS tablet Take 1 tablet by mouth daily. EVERY OTHER DAY    . rivaroxaban (XARELTO) 20 MG TABS tablet Take 1 tablet (20 mg total) by mouth daily with supper. 30 tablet    No current facility-administered medications for this encounter.     No Known Allergies  Social History   Social History  . Marital status: Single    Spouse name: N/A  . Number of children: N/A  . Years of education: N/A   Occupational History  . Not on file.   Social History Main Topics  . Smoking status: Current Some Day Smoker  . Smokeless tobacco: Never Used  . Alcohol use Yes  . Drug use:  No  . Sexual activity: Not on file   Other Topics Concern  . Not on file   Social History Narrative  . No narrative on file    No family history on file.  ROS- All systems are reviewed and negative except as per the HPI above  Physical Exam: Vitals:   05/29/17 1337  BP: 112/74  Pulse: 95  Weight: 133 lb 6.4 oz (60.5 kg)  Height: 5\' 9"  (1.753 m)   Wt Readings from Last 3 Encounters:  05/29/17 133 lb 6.4 oz (60.5 kg)  06/28/16 131 lb 9.6 oz (59.7 kg)  12/10/14 135 lb 6.4 oz (61.4 kg)    Labs: Lab Results  Component Value Date   NA 139 05/29/2017   K 4.7 05/29/2017   CL 104 05/29/2017   CO2 28 05/29/2017   GLUCOSE 90 05/29/2017   BUN 20 05/29/2017   CREATININE 0.96 05/29/2017   CALCIUM 9.9 05/29/2017   MG 1.8 10/27/2014   No results found for: INR No results found for: CHOL, HDL, LDLCALC, TRIG   GEN- The patient is well appearing, alert and oriented x 3 today.   Head- normocephalic, atraumatic Eyes-  Sclera clear, conjunctiva pink Ears- hearing intact Oropharynx- clear Neck- supple, no JVP Lymph- no cervical lymphadenopathy Lungs- Clear to ausculation bilaterally, normal work of breathing Heart- irregular rate and rhythm, no murmurs, rubs or  gallops, PMI not laterally displaced GI- soft, NT, ND, + BS Extremities- no clubbing, cyanosis, or edema MS- no significant deformity or atrophy Skin- no rash or lesion Psych- euthymic mood, full affect Neuro- strength and sensation are intact  EKG-afib at 95 bpm, qrs int 96 ms, qtc 414 ms Ekg from urgent care also reviewed Echo Study Conclusions  - Left ventricle: The cavity size was normal. Wall thickness was normal. Systolic function was normal. The estimated ejection fraction was in the range of 55% to 60%. Wall motion was normal; there were no regional wall motion abnormalities. Left ventricular diastolic function parameters were normal. - Mitral valve: Mild thickening, consistent with rheumatic  disease. There was mild regurgitation.  Impressions:  - Normal LV function; mitral valve with mild rheumatic appearance of anterior MV leaflet; mild MR.   Assessment and Plan: 1.Persistent afib, rate controlled General education re afib  He will continue to take toprol 12.5 mg daily He has a chadsvasc score of 0, will continue xarelto 20 mg daily for now in case cardioversion is neccessary  Bleeding precautions discussed Cannot identify any lifestyle strong triggers for afib Echo 2016 showed rheumatic appearing mitral value and will update this Cbc, cmet, tsh  F/u in 2-3 weeks  Bruce Lopez, ANP-C [facility] [address] [place], [place] [id number] [phone]

## 2017-05-29 NOTE — Telephone Encounter (Signed)
-----   Message from Berton BonJanine Hammond, NP sent at 05/26/2017  8:00 PM EDT ----- Regarding: Needs to be seen Monday This patient was seen in Urgent care on Friday night with possible atrial fib. He needs to be seen on Monday. Please call him to have him come in.  He is a pt of Dr. Elease HashimotoNahser.   Thanks, Berton BonJanine Hammond, AGNP-C [facility] 05/26/2017  8:02 PM

## 2017-06-05 ENCOUNTER — Telehealth (HOSPITAL_COMMUNITY): Payer: Self-pay | Admitting: *Deleted

## 2017-06-05 NOTE — Telephone Encounter (Signed)
Pt called in stating not feeling well, seems to be more short of breath and fatigued. Patient reports taking the metoprolol twice a day although instructions are for once a day and his BP is running of 100/80. Instructed pt to try only taking metoprolol 1/2 tablet at bedtime. Drink couple glasses of water today to bring BP up. Offered pt appt for today to be assessed but patient stated he would wait for now. Will call if issues continue.

## 2017-06-06 ENCOUNTER — Ambulatory Visit (HOSPITAL_COMMUNITY)
Admission: RE | Admit: 2017-06-06 | Discharge: 2017-06-06 | Disposition: A | Discharge status: Home / Self Care | Payer: BLUE CROSS/BLUE SHIELD | Source: Ambulatory Visit | Attending: Nurse Practitioner | Admitting: Nurse Practitioner

## 2017-06-06 DIAGNOSIS — I340 Nonrheumatic mitral (valve) insufficiency: Secondary | ICD-10-CM | POA: Diagnosis not present

## 2017-06-06 DIAGNOSIS — I480 Paroxysmal atrial fibrillation: Secondary | ICD-10-CM | POA: Diagnosis not present

## 2017-06-06 DIAGNOSIS — I4891 Unspecified atrial fibrillation: Secondary | ICD-10-CM | POA: Diagnosis present

## 2017-06-06 NOTE — Progress Notes (Signed)
  Echocardiogram 2D Echocardiogram has been performed.  Bruce Lopez 06/06/2017, 12:38 PM

## 2017-06-12 ENCOUNTER — Ambulatory Visit (HOSPITAL_COMMUNITY)
Admission: RE | Admit: 2017-06-12 | Discharge: 2017-06-12 | Disposition: A | Discharge status: Home / Self Care | Payer: BLUE CROSS/BLUE SHIELD | Source: Ambulatory Visit | Attending: Nurse Practitioner | Admitting: Nurse Practitioner

## 2017-06-12 ENCOUNTER — Other Ambulatory Visit (HOSPITAL_COMMUNITY): Payer: Self-pay | Admitting: Nurse Practitioner

## 2017-06-12 VITALS — BP 106/58 | HR 84 | Ht 69.0 in | Wt 134.0 lb

## 2017-06-12 DIAGNOSIS — I4819 Other persistent atrial fibrillation: Secondary | ICD-10-CM

## 2017-06-12 DIAGNOSIS — I471 Supraventricular tachycardia: Secondary | ICD-10-CM | POA: Insufficient documentation

## 2017-06-12 DIAGNOSIS — I481 Persistent atrial fibrillation: Secondary | ICD-10-CM | POA: Diagnosis not present

## 2017-06-12 DIAGNOSIS — Z7901 Long term (current) use of anticoagulants: Secondary | ICD-10-CM | POA: Diagnosis not present

## 2017-06-12 DIAGNOSIS — F17200 Nicotine dependence, unspecified, uncomplicated: Secondary | ICD-10-CM | POA: Diagnosis not present

## 2017-06-12 LAB — BASIC METABOLIC PANEL
Anion gap: 7 (ref 5–15)
BUN: 17 mg/dL (ref 6–20)
CO2: 28 mmol/L (ref 22–32)
Calcium: 9.9 mg/dL (ref 8.9–10.3)
Chloride: 104 mmol/L (ref 101–111)
Creatinine, Ser: 0.95 mg/dL (ref 0.61–1.24)
Glucose, Bld: 98 mg/dL (ref 65–99)
POTASSIUM: 4.4 mmol/L (ref 3.5–5.1)
SODIUM: 139 mmol/L (ref 135–145)

## 2017-06-12 LAB — CBC
HEMATOCRIT: 48.3 % (ref 39.0–52.0)
Hemoglobin: 16.8 g/dL (ref 13.0–17.0)
MCH: 31.7 pg (ref 26.0–34.0)
MCHC: 34.8 g/dL (ref 30.0–36.0)
MCV: 91.1 fL (ref 78.0–100.0)
Platelets: 162 10*3/uL (ref 150–400)
RBC: 5.3 MIL/uL (ref 4.22–5.81)
RDW: 12.2 % (ref 11.5–15.5)
WBC: 6.1 10*3/uL (ref 4.0–10.5)

## 2017-06-12 LAB — MAGNESIUM: Magnesium: 2.2 mg/dL (ref 1.7–2.4)

## 2017-06-12 MED ORDER — RIVAROXABAN 20 MG PO TABS: 20.0000 mg | ORAL_TABLET | Freq: Every day | ORAL | 0 refills | Status: DC

## 2017-06-12 MED ORDER — METOPROLOL SUCCINATE ER 25 MG PO TB24: 12.5000 mg | ORAL_TABLET | Freq: Every day | ORAL | 2 refills | Status: DC

## 2017-06-12 NOTE — Progress Notes (Signed)
Primary Care Physician: Patient, No Pcp Per Referring Physician: Dr. Kasandra Knudsen is a 26 y.o. male with a h/o SVT, followed by Dr. Elease Hashimoto, that was seen in an urgent care clinic for palpitations, found to be in rate controlled afib. He denies any use of caffeine, alcohol, drugs, no sleep apnea. Possibly has not slept as well for the last couple of weeks. He felt he was out of rhythm for a week before he went to urgent care. He has EKG with him that very clearly documents afib. He is in rate controlled afib today as well, HR in the 90's. Did not take BB today yet. Works as a Metallurgist. Did use  inhaler the night before he noted afib.States he is not drinking any alcohol.Dr. Melburn Popper started xarelto 20 mg a day on 8/6 and restarted metoprolol succinate 25 mg 1/2 tab a day.  On last visit, we tried to increase toprol to 25 mg 1/2 bid, to help restore SR,but he did not tolerate due to fatigue.  Unfortunately,f/u in afib clinic 8/20, he has not converted to SR over the last few weeks. He will have been on anticoagulation on 8/27 without any missed doses. Will plan on cardioversion 8/30 with Dr. Elease Hashimoto.Feels that he is tolerating afib a little better lately.Still no know triggers. Repeat echo showed rheumatic appearing valve, stable compared to last echo. He denies having rheumatic fever.  Today, he denies symptoms of palpitations, chest pain, shortness of breath, orthopnea, PND, lower extremity edema, dizziness, presyncope, syncope, or neurologic sequela. The patient is tolerating medications without difficulties and is otherwise without complaint today.   Past Medical History:  Diagnosis Date  . Palpitations    No past surgical history on file.  Current Outpatient Prescriptions  Medication Sig Dispense Refill  . Lysine HCl (L-FORMULA LYSINE HCL) 500 MG TABS Take 1 tablet by mouth daily.    . metoprolol succinate (TOPROL XL) 25 MG 24 hr tablet Take 0.5 tablets (12.5 mg total) by  mouth daily. 31 tablet 11  . Multiple Vitamin (MULTIVITAMIN WITH MINERALS) TABS tablet Take 1 tablet by mouth daily. EVERY OTHER DAY    . rivaroxaban (XARELTO) 20 MG TABS tablet Take 1 tablet (20 mg total) by mouth daily with supper. 30 tablet    No current facility-administered medications for this encounter.     No Known Allergies  Social History   Social History  . Marital status: Single    Spouse name: N/A  . Number of children: N/A  . Years of education: N/A   Occupational History  . Not on file.   Social History Main Topics  . Smoking status: Current Some Day Smoker  . Smokeless tobacco: Never Used  . Alcohol use Yes  . Drug use: No  . Sexual activity: Not on file   Other Topics Concern  . Not on file   Social History Narrative  . No narrative on file    No family history on file.  ROS- All systems are reviewed and negative except as per the HPI above  Physical Exam: Vitals:   06/12/17 1548  BP: (!) 106/58  Pulse: 84  Weight: 134 lb (60.8 kg)  Height: 5\' 9"  (1.753 m)   Wt Readings from Last 3 Encounters:  06/12/17 134 lb (60.8 kg)  05/29/17 133 lb 6.4 oz (60.5 kg)  06/28/16 131 lb 9.6 oz (59.7 kg)    Labs: Lab Results  Component Value Date   NA 139  05/29/2017   K 4.7 05/29/2017   CL 104 05/29/2017   CO2 28 05/29/2017   GLUCOSE 90 05/29/2017   BUN 20 05/29/2017   CREATININE 0.96 05/29/2017   CALCIUM 9.9 05/29/2017   MG 1.8 10/27/2014   No results found for: INR No results found for: CHOL, HDL, LDLCALC, TRIG   GEN- The patient is well appearing, alert and oriented x 3 today.   Head- normocephalic, atraumatic Eyes-  Sclera clear, conjunctiva pink Ears- hearing intact Oropharynx- clear Neck- supple, no JVP Lymph- no cervical lymphadenopathy Lungs- Clear to ausculation bilaterally, normal work of breathing Heart- irregular rate and rhythm, no murmurs, rubs or gallops, PMI not laterally displaced GI- soft, NT, ND, + BS Extremities- no  clubbing, cyanosis, or edema MS- no significant deformity or atrophy Skin- no rash or lesion Psych- euthymic mood, full affect Neuro- strength and sensation are intact  EKG-afib at 84 bpm, qrs int 96 ms, qtc 394 ms Ekg from urgent care also reviewed Echo Study Conclusions  - Left ventricle: The cavity size was normal. Systolic function was   normal. The estimated ejection fraction was in the range of 55%   to 60%. Wall motion was normal; there were no regional wall   motion abnormalities. - Aortic valve: Transvalvular velocity was within the normal range.   There was no stenosis. There was no regurgitation. - Mitral valve: Thickening. , consistent with rheumatic disease.   Transvalvular velocity was within the normal range. There was no   evidence for stenosis. There was mild regurgitation. - Right ventricle: The cavity size was normal. Wall thickness was   normal. Systolic function was normal. - Tricuspid valve: There was trivial regurgitation. - Pulmonary arteries: Systolic pressure was within the normal   range.    Assessment and Plan: 1.Persistent afib, rate controlled He will continue to take toprol 12.5 mg daily He has a chadsvasc score of 0, will continue xarelto 20 mg daily, will have been on drug without any missed doses 8/27 Will schedule for cardioversion with Dr. Elease HashimotoNahser 8/30 Risk vrs benefit explained to pt re DCCV Will hold toprol the night before to prevent pt being  slow on return to SR, can resume later that day if no significant brady Will have to continue xarleto for 4 weeks after cardioversion but if in SR, will be able to stop after that with chadsvasc score of 0 Repeat Echo, Stable, continues to show  rheumatic appearing mitral valve, pt denies having Cbc, bmet mag today  F/u in 1 week after cardioversion Will request f/u with Dr. Elease HashimotoNahser in one month  Elvina SidleDonna C. Matthew Folksarroll, ANP-C [facility] [address] [place], [place]  [id number] [phone]

## 2017-06-12 NOTE — Patient Instructions (Addendum)
Your cardioversion is scheduled for : Thursday Aug 30 at 1:00 pm Arrive at the [facility] and go to admitting at 11:30 Do Not eat or drink anything after midnight the night prior to your procedure. Take all your medications with a sip of water prior to arrival. Do NOT miss any doses of your blood thinner. You will NOT be able to drive home after your procedure.   Hold Metoprolol the night before your procedure  Follow up in afib clinic in one week after cardioversion.  This has been scheduled

## 2017-06-22 ENCOUNTER — Ambulatory Visit (HOSPITAL_COMMUNITY): Payer: BLUE CROSS/BLUE SHIELD | Admitting: Anesthesiology

## 2017-06-22 ENCOUNTER — Encounter (HOSPITAL_COMMUNITY): Payer: Self-pay | Admitting: *Deleted

## 2017-06-22 ENCOUNTER — Encounter (HOSPITAL_COMMUNITY)
Admission: RE | Disposition: A | Discharge status: Home / Self Care | Payer: Self-pay | Source: Ambulatory Visit | Attending: Cardiovascular Disease

## 2017-06-22 ENCOUNTER — Ambulatory Visit (HOSPITAL_COMMUNITY)
Admission: RE | Admit: 2017-06-22 | Discharge: 2017-06-22 | Disposition: A | Discharge status: Home / Self Care | Payer: BLUE CROSS/BLUE SHIELD | Source: Ambulatory Visit | Attending: Cardiovascular Disease | Admitting: Cardiovascular Disease

## 2017-06-22 DIAGNOSIS — I481 Persistent atrial fibrillation: Secondary | ICD-10-CM | POA: Insufficient documentation

## 2017-06-22 DIAGNOSIS — Z79899 Other long term (current) drug therapy: Secondary | ICD-10-CM | POA: Insufficient documentation

## 2017-06-22 DIAGNOSIS — Z7901 Long term (current) use of anticoagulants: Secondary | ICD-10-CM | POA: Insufficient documentation

## 2017-06-22 DIAGNOSIS — R002 Palpitations: Secondary | ICD-10-CM | POA: Diagnosis not present

## 2017-06-22 DIAGNOSIS — I4819 Other persistent atrial fibrillation: Secondary | ICD-10-CM

## 2017-06-22 DIAGNOSIS — F17200 Nicotine dependence, unspecified, uncomplicated: Secondary | ICD-10-CM | POA: Diagnosis not present

## 2017-06-22 HISTORY — PX: CARDIOVERSION: SHX1299

## 2017-06-22 SURGERY — CARDIOVERSION
Anesthesia: General

## 2017-06-22 MED ORDER — PROPOFOL 10 MG/ML IV BOLUS
INTRAVENOUS | Status: DC | PRN
  Administered 2017-06-22: 40 mg via INTRAVENOUS
  Administered 2017-06-22: 60 mg via INTRAVENOUS
  Administered 2017-06-22: 40 mg via INTRAVENOUS
  Administered 2017-06-22: 60 mg via INTRAVENOUS

## 2017-06-22 MED ORDER — SODIUM CHLORIDE 0.9 % IV SOLN
INTRAVENOUS | Status: DC | PRN
  Administered 2017-06-22: 13:00:00 via INTRAVENOUS

## 2017-06-22 NOTE — Discharge Instructions (Signed)
Electrical Cardioversion, Care After °This sheet gives you information about how to care for yourself after your procedure. Your health care provider may also give you more specific instructions. If you have problems or questions, contact your health care provider. °What can I expect after the procedure? °After the procedure, it is common to have: °· Some redness on the skin where the shocks were given. ° °Follow these instructions at home: °· Do not drive for 24 hours if you were given a medicine to help you relax (sedative). °· Take over-the-counter and prescription medicines only as told by your health care provider. °· Ask your health care provider how to check your pulse. Check it often. °· Rest for 48 hours after the procedure or as told by your health care provider. °· Avoid or limit your caffeine use as told by your health care provider. °Contact a health care provider if: °· You feel like your heart is beating too quickly or your pulse is not regular. °· You have a serious muscle cramp that does not go away. °Get help right away if: °· You have discomfort in your chest. °· You are dizzy or you feel faint. °· You have trouble breathing or you are short of breath. °· Your speech is slurred. °· You have trouble moving an arm or leg on one side of your body. °· Your fingers or toes turn cold or blue. °This information is not intended to replace advice given to you by your health care provider. Make sure you discuss any questions you have with your health care provider. °Document Released: 07/31/2013 Document Revised: 05/13/2016 Document Reviewed: 04/15/2016 °Elsevier Interactive Patient Education © 2018 [employer] ° °

## 2017-06-22 NOTE — Interval H&P Note (Signed)
History and Physical Interval Note:  06/22/2017 12:47 PM  Bruce Lopez  has presented today for surgery, with the diagnosis of AFIB  The various methods of treatment have been discussed with the patient and family. After consideration of risks, benefits and other options for treatment, the patient has consented to  Procedure(s): CARDIOVERSION (N/A) as a surgical intervention .  The patient's history has been reviewed, patient examined, no change in status, stable for surgery.  I have reviewed the patient's chart and labs.  Questions were answered to the patient's satisfaction.     Kristeen MissPhilip Nahser

## 2017-06-22 NOTE — CV Procedure (Signed)
    Cardioversion Note  [name] A Cassaday [id number] 10/07/1991  Procedure: DC Cardioversion Indications: Atrial fib   Procedure Details Consent: Obtained Time Out: Verified patient identification, verified procedure, site/side was marked, verified correct patient position, special equipment/implants available, Radiology Safety Procedures followed,  medications/allergies/relevent history reviewed, required imaging and test results available.  Performed  The patient has been on adequate anticoagulation.  The patient received IV Lidocaine 50 mg IV followed by Propofol 200 mg IV  for sedation.  Synchronous cardioversion was performed at 75 joules.  The cardioversion was successful     Complications: No apparent complications Patient did tolerate procedure well.   Vesta MixerPhilip J. Jerie Basford, Montez HagemanJr., MD, [facility] 06/22/2017, 1:09 PM

## 2017-06-22 NOTE — H&P (View-Only) (Signed)
 Primary Care Physician: Patient, No Pcp Per Referring Physician: Dr. Nahser   Bruce Lopez is a 26 y.o. male with a h/o SVT, followed by Dr. Nahser, that was seen in an urgent care clinic for palpitations, found to be in rate controlled afib. He denies any use of caffeine, alcohol, drugs, no sleep apnea. Possibly has not slept as well for the last couple of weeks. He felt he was out of rhythm for a week before he went to urgent care. He has EKG with him that very clearly documents afib. He is in rate controlled afib today as well, HR in the 90's. Did not take BB today yet. Works as a youth counselor. Did use  inhaler the night before he noted afib.States he is not drinking any alcohol.Dr. Nasher started xarelto 20 mg a day on 8/6 and restarted metoprolol succinate 25 mg 1/2 tab a day.  On last visit, we tried to increase toprol to 25 mg 1/2 bid, to help restore SR,but he did not tolerate due to fatigue.  Unfortunately,f/u in afib clinic 8/20, he has not converted to SR over the last few weeks. He will have been on anticoagulation on 8/27 without any missed doses. Will plan on cardioversion 8/30 with Dr. Nahser.Feels that he is tolerating afib a little better lately.Still no know triggers. Repeat echo showed rheumatic appearing valve, stable compared to last echo. He denies having rheumatic fever.  Today, he denies symptoms of palpitations, chest pain, shortness of breath, orthopnea, PND, lower extremity edema, dizziness, presyncope, syncope, or neurologic sequela. The patient is tolerating medications without difficulties and is otherwise without complaint today.   Past Medical History:  Diagnosis Date  . Palpitations    No past surgical history on file.  Current Outpatient Prescriptions  Medication Sig Dispense Refill  . Lysine HCl (L-FORMULA LYSINE HCL) 500 MG TABS Take 1 tablet by mouth daily.    . metoprolol succinate (TOPROL XL) 25 MG 24 hr tablet Take 0.5 tablets (12.5 mg total) by  mouth daily. 31 tablet 11  . Multiple Vitamin (MULTIVITAMIN WITH MINERALS) TABS tablet Take 1 tablet by mouth daily. EVERY OTHER DAY    . rivaroxaban (XARELTO) 20 MG TABS tablet Take 1 tablet (20 mg total) by mouth daily with supper. 30 tablet    No current facility-administered medications for this encounter.     No Known Allergies  Social History   Social History  . Marital status: Single    Spouse name: N/A  . Number of children: N/A  . Years of education: N/A   Occupational History  . Not on file.   Social History Main Topics  . Smoking status: Current Some Day Smoker  . Smokeless tobacco: Never Used  . Alcohol use Yes  . Drug use: No  . Sexual activity: Not on file   Other Topics Concern  . Not on file   Social History Narrative  . No narrative on file    No family history on file.  ROS- All systems are reviewed and negative except as per the HPI above  Physical Exam: Vitals:   06/12/17 1548  BP: (!) 106/58  Pulse: 84  Weight: 134 lb (60.8 kg)  Height: 5' 9" (1.753 m)   Wt Readings from Last 3 Encounters:  06/12/17 134 lb (60.8 kg)  05/29/17 133 lb 6.4 oz (60.5 kg)  06/28/16 131 lb 9.6 oz (59.7 kg)    Labs: Lab Results  Component Value Date   NA 139   05/29/2017   K 4.7 05/29/2017   CL 104 05/29/2017   CO2 28 05/29/2017   GLUCOSE 90 05/29/2017   BUN 20 05/29/2017   CREATININE 0.96 05/29/2017   CALCIUM 9.9 05/29/2017   MG 1.8 10/27/2014   No results found for: INR No results found for: CHOL, HDL, LDLCALC, TRIG   GEN- The patient is well appearing, alert and oriented x 3 today.   Head- normocephalic, atraumatic Eyes-  Sclera clear, conjunctiva pink Ears- hearing intact Oropharynx- clear Neck- supple, no JVP Lymph- no cervical lymphadenopathy Lungs- Clear to ausculation bilaterally, normal work of breathing Heart- irregular rate and rhythm, no murmurs, rubs or gallops, PMI not laterally displaced GI- soft, NT, ND, + BS Extremities- no  clubbing, cyanosis, or edema MS- no significant deformity or atrophy Skin- no rash or lesion Psych- euthymic mood, full affect Neuro- strength and sensation are intact  EKG-afib at 84 bpm, qrs int 96 ms, qtc 394 ms Ekg from urgent care also reviewed Echo Study Conclusions  - Left ventricle: The cavity size was normal. Systolic function was   normal. The estimated ejection fraction was in the range of 55%   to 60%. Wall motion was normal; there were no regional wall   motion abnormalities. - Aortic valve: Transvalvular velocity was within the normal range.   There was no stenosis. There was no regurgitation. - Mitral valve: Thickening. , consistent with rheumatic disease.   Transvalvular velocity was within the normal range. There was no   evidence for stenosis. There was mild regurgitation. - Right ventricle: The cavity size was normal. Wall thickness was   normal. Systolic function was normal. - Tricuspid valve: There was trivial regurgitation. - Pulmonary arteries: Systolic pressure was within the normal   range.    Assessment and Plan: 1.Persistent afib, rate controlled He will continue to take toprol 12.5 mg daily He has a chadsvasc score of 0, will continue xarelto 20 mg daily, will have been on drug without any missed doses 8/27 Will schedule for cardioversion with Dr. Elease HashimotoNahser 8/30 Risk vrs benefit explained to pt re DCCV Will hold toprol the night before to prevent pt being  slow on return to SR, can resume later that day if no significant brady Will have to continue xarleto for 4 weeks after cardioversion but if in SR, will be able to stop after that with chadsvasc score of 0 Repeat Echo, Stable, continues to show  rheumatic appearing mitral valve, pt denies having Cbc, bmet mag today  F/u in 1 week after cardioversion Will request f/u with Dr. Elease HashimotoNahser in one month  Elvina SidleDonna C. Matthew Folksarroll, ANP-C [facility] [address] [place], [place]  [id number] [phone]

## 2017-06-22 NOTE — Anesthesia Preprocedure Evaluation (Addendum)
Anesthesia Evaluation  Patient identified by MRN, date of birth, ID band Patient awake    Reviewed: Allergy & Precautions, NPO status , Patient's Chart, lab work & pertinent test results  History of Anesthesia Complications Negative for: history of anesthetic complications  Airway Mallampati: II  TM Distance: >3 FB Neck ROM: Full    Dental no notable dental hx.    Pulmonary neg pulmonary ROS, Current Smoker,    breath sounds clear to auscultation       Cardiovascular + dysrhythmias  Rhythm:Irregular Rate:Tachycardia     Neuro/Psych negative neurological ROS  negative psych ROS   GI/Hepatic negative GI ROS, Neg liver ROS,   Endo/Other  negative endocrine ROS  Renal/GU negative Renal ROS     Musculoskeletal   Abdominal   Peds  Hematology negative hematology ROS (+)   Anesthesia Other Findings Echo 8/18  Study Conclusions  - Left ventricle: The cavity size was normal. Systolic function was   normal. The estimated ejection fraction was in the range of 55%   to 60%. Wall motion was normal; there were no regional wall   motion abnormalities. - Aortic valve: Transvalvular velocity was within the normal range.   There was no stenosis. There was no regurgitation. - Mitral valve: Thickening. , consistent with rheumatic disease.   Transvalvular velocity was within the normal range. There was no   evidence for stenosis. There was mild regurgitation. - Right ventricle: The cavity size was normal. Wall thickness was   normal. Systolic function was normal. - Tricuspid valve: There was trivial regurgitation. - Pulmonary arteries: Systolic pressure was within the normal   range.   Reproductive/Obstetrics                          Anesthesia Physical Anesthesia Plan  ASA: II  Anesthesia Plan: General   Post-op Pain Management:    Induction: Intravenous  PONV Risk Score and Plan: 2 and  Treatment may vary due to age or medical condition  Airway Management Planned: Mask  Additional Equipment: None  Intra-op Plan:   Post-operative Plan:   Informed Consent: I have reviewed the patients History and Physical, chart, labs and discussed the procedure including the risks, benefits and alternatives for the proposed anesthesia with the patient or authorized representative who has indicated his/her understanding and acceptance.     Plan Discussed with: CRNA and Surgeon  Anesthesia Plan Comments: ( )       Anesthesia Quick Evaluation

## 2017-06-22 NOTE — Transfer of Care (Signed)
Immediate Anesthesia Transfer of Care Note  Patient: Bruce Lopez  Procedure(s) Performed: Procedure(s): CARDIOVERSION (N/A)  Patient Location: Endoscopy Unit  Anesthesia Type:General  Level of Consciousness: awake, alert , oriented and patient cooperative  Airway & Oxygen Therapy: Patient Spontanous Breathing and Patient connected to nasal cannula oxygen  Post-op Assessment: Report given to RN, Post -op Vital signs reviewed and stable, Patient moving all extremities and Patient moving all extremities X 4  Post vital signs: Reviewed and stable  Last Vitals:  Vitals:   06/22/17 1150  BP: 134/84  Pulse: (!) 107  Resp: 10  Temp: 36.7 C  SpO2: 100%    Last Pain:  Vitals:   06/22/17 1150  TempSrc: Oral         Complications: No apparent anesthesia complications

## 2017-06-23 ENCOUNTER — Telehealth: Payer: Self-pay | Admitting: Cardiovascular Disease

## 2017-06-23 ENCOUNTER — Encounter (HOSPITAL_COMMUNITY): Payer: Self-pay | Admitting: Cardiovascular Disease

## 2017-06-23 NOTE — Anesthesia Postprocedure Evaluation (Signed)
Anesthesia Post Note  Patient: Bruce Lopez  Procedure(s) Performed: Procedure(s) (LRB): CARDIOVERSION (N/A)     Patient location during evaluation: Endoscopy Anesthesia Type: General Level of consciousness: awake and alert Pain management: pain level controlled Vital Signs Assessment: post-procedure vital signs reviewed and stable Respiratory status: spontaneous breathing, nonlabored ventilation, respiratory function stable and patient connected to nasal cannula oxygen Cardiovascular status: stable Postop Assessment: no signs of nausea or vomiting Anesthetic complications: no    Last Vitals:  Vitals:   06/22/17 1330 06/22/17 1340  BP: 106/78 113/81  Pulse: 73 76  Resp: 16 (!) 0  Temp:    SpO2: 99% 100%    Last Pain:  Vitals:   06/22/17 1310  TempSrc: Oral                 Tziporah Knoke

## 2017-06-23 NOTE — Telephone Encounter (Signed)
Spoke with patient's mother who is with the patient and states he has gone back into a fib with a HR of 75 bpm. I asked her about the mechanism for checking the HR and she states she has used a pulse ox. I asked her to check the pulse manually, which she states she is comfortable with, and she reports HR of 60 bpm. She reports BP is 96/70 mmHg now. I spoke with Dr. Elease HashimotoNahser who is in the office and he advised that patient come in for an appointment with either himself or Rudi Cocoonna Carroll, NP, who ever has soonest appointment. I scheduled patient for Tuesday 9/4 at 11:40 am and advised that per Dr. Elease HashimotoNahser, if HR increases patient may take additional Toprol XL 12.5 mg. Mother states that the patient thinks his symptoms may have gotten worse after taking Toprol XL 12.5 mg last night. I advised that he may hold the Toprol if he feels that this made his symptoms worse. I advised that he should not stop the Xarelto. She verbalized understanding and agreement with plan and thanked me for the call.

## 2017-06-23 NOTE — Telephone Encounter (Signed)
New Message  Patient c/o Palpitations:  High priority if patient c/o lightheadedness and shortness of breath.  1. How long have you been having palpitations? Last night started   2. Are you currently experiencing lightheadedness and shortness of breath? Was sob, per pt mom pt has been laying down today.  3. Have you checked your BP and heart rate? (document readings) no  4. Are you experiencing any other symptoms? No  Per pt mom pt is in afib. Please call back to discuss

## 2017-06-25 ENCOUNTER — Telehealth: Payer: Self-pay | Admitting: Cardiology

## 2017-06-25 NOTE — Telephone Encounter (Signed)
Patent called stating that he ran out of his Xarelto and had a DCCV on 06/22/2017.  Prescription called in for Xarelto 20mg  daily #30 tablets with 1 refill.

## 2017-06-27 ENCOUNTER — Ambulatory Visit (INDEPENDENT_AMBULATORY_CARE_PROVIDER_SITE_OTHER): Payer: BLUE CROSS/BLUE SHIELD | Admitting: Cardiovascular Disease

## 2017-06-27 ENCOUNTER — Encounter: Payer: Self-pay | Admitting: Cardiovascular Disease

## 2017-06-27 ENCOUNTER — Encounter (INDEPENDENT_AMBULATORY_CARE_PROVIDER_SITE_OTHER): Payer: Self-pay

## 2017-06-27 VITALS — BP 108/76 | HR 63 | Ht 69.0 in | Wt 136.8 lb

## 2017-06-27 DIAGNOSIS — I480 Paroxysmal atrial fibrillation: Secondary | ICD-10-CM | POA: Diagnosis not present

## 2017-06-27 NOTE — Progress Notes (Signed)
Cardiology Office Note   Date:  06/27/2017   ID:  Bruce Lopez, DOB 1991-02-11, MRN [id number]  PCP:  Patient, No Pcp Per  Cardiologist:   Kristeen MissPhilip Mikaeel Petrow, MD   Chief Complaint  Patient presents with  . Follow-up    atrial fib,  s/p recent cardioversion   1. Paroxysmal atrial fibrillation   History of Present Illness:  Bruce Lopez is a 26 with hx of tachypalpitations. He has been seen for the past several years by Dr. Jacinto HalimGanji. He has episodes of pre-syncope.  These episodes last for 30 minutes - 1 hour   He was seen in the emergency department on January 4. He had sinus tachycardia versus SVT. Heart rate was 156. Received IV NS His HR gradually slowed and was normal.  Eats and drinks regularly Drinks on occasion. He drank quite a lot on New years eve. Felt poorly for the next several days and then eventually went to the ER on Monday , Jan. 4.  Feels better today. Still has some palpitations.  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 Used to work as a MetallurgistYouth counselor. Drinks on occasion Non smoker Fhx: No cardiac hx.  No street drugs.   Feb. 17, 2016:   Bruce Lopez is a 26 y.o. male who presents for  Follow-up of his tachycardia. I saw him several months ago. He been drinking lots of alcohol. I advised him to abstain from drinking so much alcohol and we started him on Toprol-XL 25 mg a day.  It took about a week for him to feel more like his usual self.   He's feeling much better since that time.   He has had a few episodes of dizziness when standing   Sept. 4, 2018:  Bruce Lopez was seen in the A-fib clinic several weeks ago and was found have atrial fibrillation. He was set up for heart inversion by Rudi Cocoonna Carroll. He had a successful cardioversion on Thursday but called the following day with significant palpitations. Thinks that he was back in atrial fibrillation. He thinks the metoprolol was worsening his paliptations . He stopped the metoprolol and converted back into NSR 3 days later.    Past Medical History:  Diagnosis Date  . Palpitations     Past Surgical History:  Procedure Laterality Date  . CARDIOVERSION N/A 06/22/2017   Procedure: CARDIOVERSION;  Surgeon: Elease HashimotoNahser, Deloris PingPhilip J, MD;  [facility];  Service: Cardiovascular;  Laterality: N/A;     Current Outpatient Prescriptions  Medication Sig Dispense Refill  . Lysine HCl (L-FORMULA LYSINE HCL) 500 MG TABS Take 1 tablet by mouth daily.    . Multiple Vitamin (MULTIVITAMIN WITH MINERALS) TABS tablet Take 1 tablet by mouth daily.     . rivaroxaban (XARELTO) 20 MG TABS tablet Take 1 tablet (20 mg total) by mouth daily with supper. 30 tablet 0   No current facility-administered medications for this visit.     Allergies:   Patient has no known allergies.    Social History:  The patient  reports that he has been smoking.  He has never used smokeless tobacco. He reports that he drinks alcohol. He reports that he does not use drugs.   Family History:  The patient's family history is not on file.    ROS:  Please see the history of present illness.    Review of Systems: Constitutional:  denies fever, chills, diaphoresis, appetite change and fatigue.  HEENT: denies photophobia, eye pain, redness, hearing loss, ear pain, congestion, sore  throat, rhinorrhea, sneezing, neck pain, neck stiffness and tinnitus.  Respiratory: denies SOB, DOE, cough, chest tightness, and wheezing.  Cardiovascular: denies chest pain, palpitations and leg swelling.  Gastrointestinal: denies nausea, vomiting, abdominal pain, diarrhea, constipation, blood in stool.  Genitourinary: denies dysuria, urgency, frequency, hematuria, flank pain and difficulty urinating.  Musculoskeletal: denies  myalgias, back pain, joint swelling, arthralgias and gait problem.   Skin: denies pallor, rash and wound.  Neurological: denies dizziness, seizures, syncope, weakness, light-headedness, numbness and headaches.   Hematological: denies adenopathy, easy  bruising, personal or family bleeding history.  Psychiatric/ Behavioral: denies suicidal ideation, mood changes, confusion, nervousness, sleep disturbance and agitation.       All other systems are reviewed and negative.    PHYSICAL EXAM: VS:  BP 108/76   Pulse 63   Ht 5\' 9"  (1.753 m)   Wt 136 lb 12.8 oz (62.1 kg)   BMI 20.20 kg/m  , BMI Body mass index is 20.2 kg/m. GEN: Well nourished, well developed, in no acute distress  HEENT: normal  Neck: no JVD, carotid bruits, or masses Cardiac: RRR; soft systolic 1-2  / 6 systolic murmurs,no edema  Respiratory:  clear to auscultation bilaterally, normal work of breathing GI: soft, nontender, nondistended, + BS MS: no deformity or atrophy  Skin: warm and dry, no rash Neuro:  Strength and sensation are intact Psych: normal   EKG:  EKG is ordered today. Sinus rhythm at 63.     Recent Labs: 05/29/2017: TSH 1.379 06/12/2017: BUN 17; Creatinine, Ser 0.95; Hemoglobin 16.8; Magnesium 2.2; Platelets 162; Potassium 4.4; Sodium 139    Lipid Panel No results found for: CHOL, TRIG, HDL, CHOLHDL, VLDL, LDLCALC, LDLDIRECT    Wt Readings from Last 3 Encounters:  06/27/17 136 lb 12.8 oz (62.1 kg)  06/12/17 134 lb (60.8 kg)  05/29/17 133 lb 6.4 oz (60.5 kg)     Other studies Reviewed: Additional studies/ records that were reviewed today include: . Review of the above records demonstrates:    ASSESSMENT AND PLAN:  1.   Atrial fibrillation: Mukhtar had a cardioversion last week. He called next day stating that he was having lots of palpitations. He thought that he may be back in atrial fibrillation. ECG today shows NSR . Will DC the metoprolol - he has stopped it anyway and seems to feel better  Off the metoprolol .   Signed, Kristeen Miss, MD  06/27/2017 12:16 PM    [facility] [address] [place], [place], [place]  [address] Phone: [phone]; Fax: [phone]

## 2017-06-27 NOTE — Patient Instructions (Signed)
Medication Instructions:  STOP Metoprolol (Toprol)   Labwork: None Ordered   Testing/Procedures: None Ordered   Follow-Up: Your physician recommends that you schedule a follow-up appointment in: 3 months with Dr. Elease HashimotoNahser   If you need a refill on your cardiac medications before your next appointment, please call your pharmacy.   Thank you for choosing [facility]! Eligha BridegroomMichelle Lakiya Cottam, RN [phone]

## 2017-06-29 ENCOUNTER — Ambulatory Visit (HOSPITAL_COMMUNITY): Payer: BLUE CROSS/BLUE SHIELD | Admitting: Nurse Practitioner

## 2017-07-04 ENCOUNTER — Ambulatory Visit: Payer: BLUE CROSS/BLUE SHIELD | Admitting: Physician Assistant

## 2017-08-11 IMAGING — CR DG CHEST 2V
2 series · 2 of 2 positions shown · non-contrast
Comparison: 10/27/2014

CLINICAL DATA: Shortness of breath with exertion for over week
worse today

EXAM:
CHEST  2 VIEW

[chest pa]
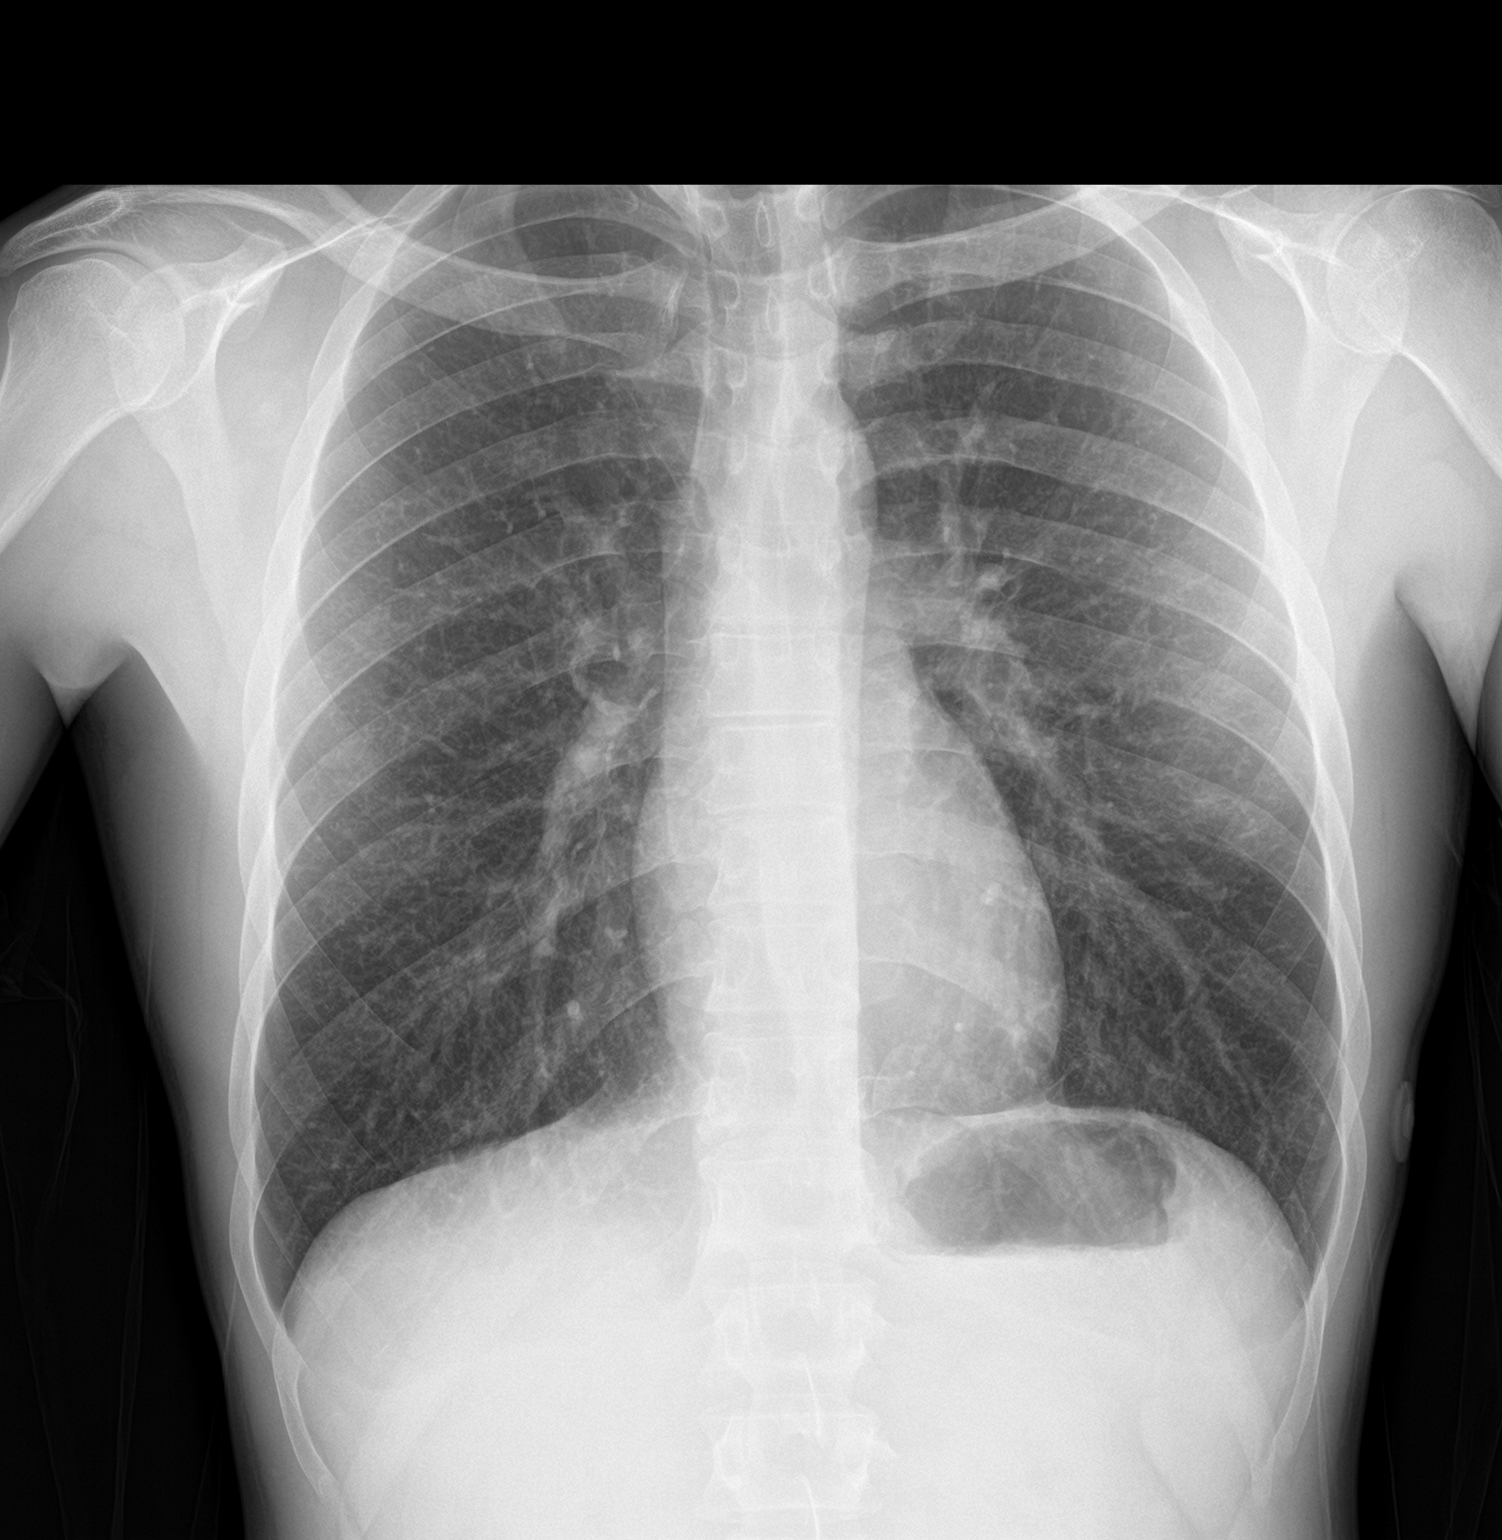

[chest lat]
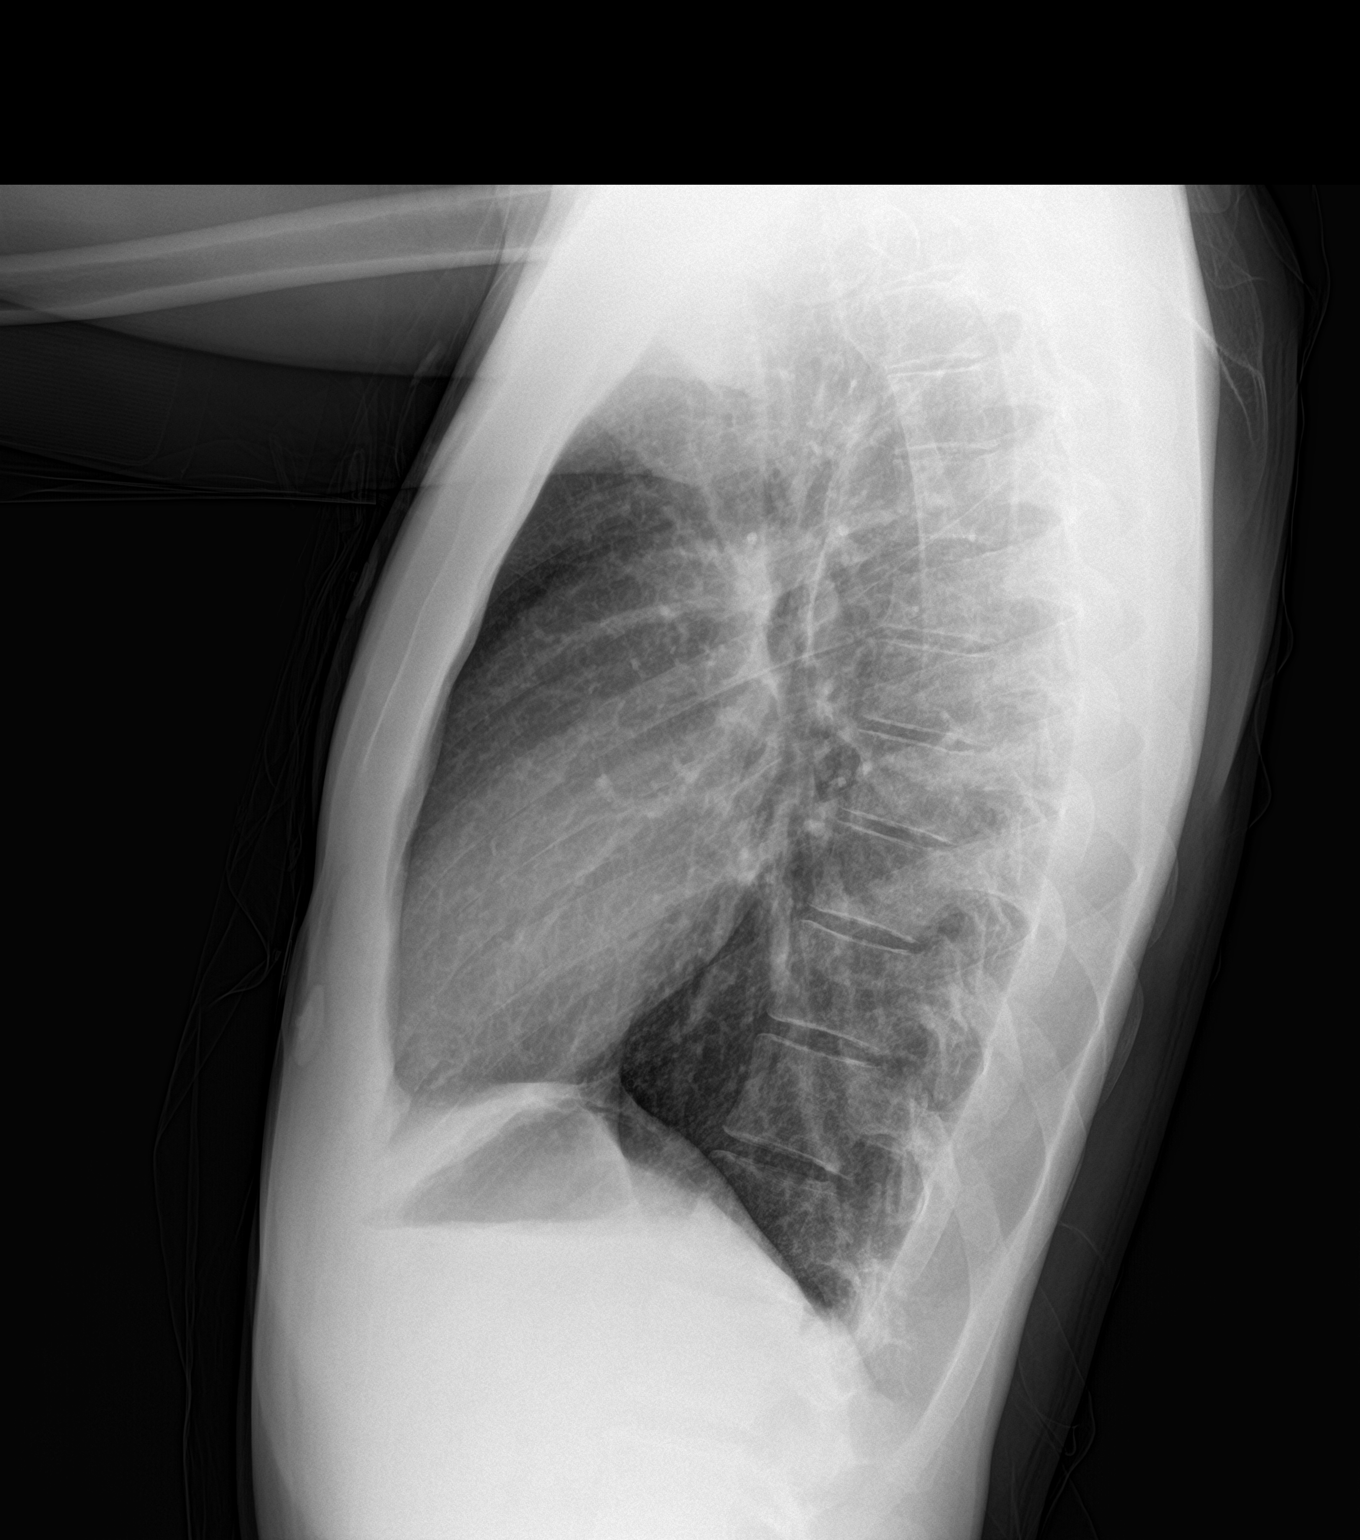

[2 of 2 positions shown; findings below may reference images not displayed]

FINDINGS: Normal heart size, mediastinal contours, and pulmonary vascularity.

Minimal chronic bronchitic changes.

Lungs otherwise clear.

No pleural effusion or pneumothorax.

No acute bony abnormalities.
IMPRESSION: Minimal chronic bronchitic changes without acute infiltrate.

## 2017-08-28 ENCOUNTER — Telehealth: Payer: Self-pay | Admitting: Internal Medicine

## 2017-08-28 NOTE — Telephone Encounter (Signed)
Mr Bruce Lopez is  A 26 year old  Male  With h/o PAF, s/p CVN in  Sep 2018  And  Was  Last  Seen in  Jun 27 2017  S/p  CV   And  Was given Xarelto 20 mg  Tab   And  Zero  Refill for  Presumably 30 days  NOVAC  S/p CVN  And  He  Was  Noted to be  In NSR in EKG  In Jun 27 2017 . Today he  Is  Asymptomatic  And called in to ask if  He  Needs  Refills on xarleto .  It  Appears  He  Doesn't , but  I advised him to check first thing  In AM  With  Cardiology  Clinic, MA  Staff to be d/W Bruce Lopez  And I will send  A  Communication  As well.

## 2017-10-02 ENCOUNTER — Ambulatory Visit: Payer: BLUE CROSS/BLUE SHIELD | Admitting: Cardiovascular Disease

## 2017-10-03 ENCOUNTER — Ambulatory Visit: Payer: BLUE CROSS/BLUE SHIELD | Admitting: Cardiovascular Disease

## 2017-11-28 ENCOUNTER — Ambulatory Visit: Payer: 59 | Admitting: Cardiovascular Disease

## 2017-11-28 ENCOUNTER — Encounter: Payer: Self-pay | Admitting: Cardiovascular Disease

## 2017-11-28 VITALS — BP 100/76 | HR 76 | Ht 70.0 in | Wt 136.2 lb

## 2017-11-28 DIAGNOSIS — I480 Paroxysmal atrial fibrillation: Secondary | ICD-10-CM | POA: Diagnosis not present

## 2017-11-28 NOTE — Patient Instructions (Signed)

## 2017-11-28 NOTE — Progress Notes (Signed)
Cardiology Office Note   Date:  11/28/2017   ID:  Bruce Lopez, DOB [birth year], MRN [id number]  PCP:  Patient, No Pcp Per  Cardiologist:   Kristeen Miss, MD   Chief Complaint  Patient presents with  . Atrial Fibrillation   1. Paroxysmal atrial fibrillation   History of Present Illness:  Bruce Lopez is a 27 with hx of tachypalpitations. He has been seen for the past several years by Dr. Jacinto Halim. He has episodes of pre-syncope.  These episodes last for 30 minutes - 1 hour   He was seen in the emergency department on January 4. He had sinus tachycardia versus SVT. Heart rate was 156. Received IV NS His HR gradually slowed and was normal.  Eats and drinks regularly Drinks on occasion. He drank quite a lot on New years eve. Felt poorly for the next several days and then eventually went to the ER on Monday , Jan. 4.  Feels better today. Still has some palpitations.  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 Used to work as a Metallurgist. Drinks on occasion Non smoker Fhx: No cardiac hx.  No street drugs.   Feb. 17, 2016:   Bruce Lopez is a 27 y.o. male who presents for  Follow-up of his tachycardia. I saw him several months ago. He been drinking lots of alcohol. I advised him to abstain from drinking so much alcohol and we started him on Toprol-XL 25 mg a day.  It took about a week for him to feel more like his usual self.   He's feeling much better since that time.   He has had a few episodes of dizziness when standing   Sept. 4, 2018:  Bruce Lopez was seen in the A-fib clinic several weeks ago and was found have atrial fibrillation. He was set up for Cardioversion  by Rudi Coco. He had a successful cardioversion on Thursday but called the following day with significant palpitations. Thinks that he was back in atrial fibrillation. He thinks the metoprolol was worsening his paliptations . He stopped the metoprolol and converted back into NSR 3 days later.   November 28, 2017:  Has not  been feeling well for the past couple of weeks. More dyspnea.    Sharp CP ,  Last a few seconds. Not associated with deep breaths. Occasionally associated with turning his torso.   Is not exercising - has too much DOE and fatigue  Stopped smoking . No ETOH .    Has not been getting much sleep.  Is working longer hours - 15 hours a day .   Works as a Recruitment consultant in [employer] .    Past Medical History:  Diagnosis Date  . Palpitations     Past Surgical History:  Procedure Laterality Date  . CARDIOVERSION N/A 06/22/2017   Procedure: CARDIOVERSION;  Surgeon: Elease Hashimoto Deloris Ping, MD;  Location: [facility];  Service: Cardiovascular;  Laterality: N/A;     Current Outpatient Medications  Medication Sig Dispense Refill  . Lysine HCl (L-FORMULA LYSINE HCL) 500 MG TABS Take 1 tablet by mouth daily.    . Multiple Vitamin (MULTIVITAMIN WITH MINERALS) TABS tablet Take 1 tablet by mouth daily.      No current facility-administered medications for this visit.     Allergies:   Patient has no known allergies.    Social History:  The patient  reports that he has been smoking.  he has never used smokeless tobacco. He reports that he drinks alcohol. He  reports that he does not use drugs.   Family History:  The patient's family history includes Atrial fibrillation in his paternal grandmother.    ROS:   Noted in current history.  Otherwise systems are negative.   Physical Exam: Blood pressure 100/76, pulse 76, height 5\' 10"  (1.778 m), weight 136 lb 3.2 oz (61.8 kg), SpO2 98 %.  GEN:  Well nourished, well developed in no acute distress HEENT: Normal NECK: No JVD; No carotid bruits LYMPHATICS: No lymphadenopathy CARDIAC: RR, soft systolic  Murmur, no rubs, gallops RESPIRATORY:  Clear to auscultation without rales, wheezing or rhonchi  ABDOMEN: Soft, non-tender, non-distended MUSCULOSKELETAL:  No edema; No deformity  SKIN: Warm and dry NEUROLOGIC:  Alert and oriented x 3    EKG:         Recent Labs: 05/29/2017: TSH 1.379 06/12/2017: BUN 17; Creatinine, Ser 0.95; Hemoglobin 16.8; Magnesium 2.2; Platelets 162; Potassium 4.4; Sodium 139    Lipid Panel No results found for: CHOL, TRIG, HDL, CHOLHDL, VLDL, LDLCALC, LDLDIRECT    Wt Readings from Last 3 Encounters:  11/28/17 136 lb 3.2 oz (61.8 kg)  06/27/17 136 lb 12.8 oz (62.1 kg)  06/12/17 134 lb (60.8 kg)     Other studies Reviewed: Additional studies/ records that were reviewed today include: . Review of the above records demonstrates:    ASSESSMENT AND PLAN:  1.   Atrial fibrillation: Right he has not had any further episodes of atrial fibrillation.  He is off Xarelto.  We will continue to observe.  I will see him in 1 year.  2.   Fatigue :    Vague symptoms.  I encouraged him to see his primary medical doctor.   Signed, Kristeen MissPhilip Nahser, MD  11/28/2017 8:46 AM    [facility] [address], [place], [place]  [address] Phone: [phone]; Fax: [phone]

## 2017-12-01 ENCOUNTER — Ambulatory Visit: Payer: 59 | Admitting: Urgent Care

## 2017-12-01 ENCOUNTER — Ambulatory Visit: Payer: Self-pay | Admitting: *Deleted

## 2017-12-01 ENCOUNTER — Encounter: Payer: Self-pay | Admitting: Urgent Care

## 2017-12-01 VITALS — BP 118/76 | HR 76 | Temp 97.9°F | Resp 16 | Ht 70.0 in | Wt 134.0 lb

## 2017-12-01 DIAGNOSIS — R300 Dysuria: Secondary | ICD-10-CM | POA: Diagnosis not present

## 2017-12-01 DIAGNOSIS — N5089 Other specified disorders of the male genital organs: Secondary | ICD-10-CM | POA: Diagnosis not present

## 2017-12-01 DIAGNOSIS — R1013 Epigastric pain: Secondary | ICD-10-CM | POA: Diagnosis not present

## 2017-12-01 DIAGNOSIS — R8290 Unspecified abnormal findings in urine: Secondary | ICD-10-CM

## 2017-12-01 DIAGNOSIS — Z8679 Personal history of other diseases of the circulatory system: Secondary | ICD-10-CM

## 2017-12-01 DIAGNOSIS — R0789 Other chest pain: Secondary | ICD-10-CM | POA: Diagnosis not present

## 2017-12-01 LAB — POCT URINALYSIS DIP (MANUAL ENTRY)
Bilirubin, UA: NEGATIVE
Blood, UA: NEGATIVE
Glucose, UA: NEGATIVE mg/dL
Ketones, POC UA: NEGATIVE mg/dL
Leukocytes, UA: NEGATIVE
Nitrite, UA: NEGATIVE
Protein Ur, POC: NEGATIVE mg/dL
Spec Grav, UA: 1.02 (ref 1.010–1.025)
Urobilinogen, UA: 0.2 E.U./dL
pH, UA: 7 (ref 5.0–8.0)

## 2017-12-01 LAB — POC MICROSCOPIC URINALYSIS (UMFC): Mucus: ABSENT

## 2017-12-01 LAB — POCT CBC
GRANULOCYTE PERCENT: 60.8 % (ref 37–80)
HEMATOCRIT: 49.4 % (ref 43.5–53.7)
Hemoglobin: 16.6 g/dL (ref 14.1–18.1)
Lymph, poc: 1.9 (ref 0.6–3.4)
MCH: 31.5 pg — AB (ref 27–31.2)
MCHC: 33.7 g/dL (ref 31.8–35.4)
MCV: 93.4 fL (ref 80–97)
MID (cbc): 0.4 (ref 0–0.9)
MPV: 8.7 fL (ref 0–99.8)
PLATELET COUNT, POC: 157 10*3/uL (ref 142–424)
POC GRANULOCYTE: 3.5 (ref 2–6.9)
POC LYMPH PERCENT: 32.5 %L (ref 10–50)
POC MID %: 6.7 %M (ref 0–12)
RBC: 5.28 M/uL (ref 4.69–6.13)
RDW, POC: 12.6 %
WBC: 5.7 10*3/uL (ref 4.6–10.2)

## 2017-12-01 MED ORDER — MELOXICAM 7.5 MG PO TABS
7.5000 mg | ORAL_TABLET | Freq: Every day | ORAL | 0 refills | Status: DC
Start: 2017-12-01 — End: 2018-02-20

## 2017-12-01 NOTE — Progress Notes (Signed)
MRN: [id number] DOB: 03-26-1991  Subjective:   Bruce Lopez is a 27 y.o. male presenting for 1 month history of intermittent right sided - mid sternal chest pain. Pain was sharp in nature, was transient lasting several seconds, happens when he walks or exerts himself, at the end of his day when he is fatigued. Has a history of atrial fibrillation, s/p cardioversion 06/22/2017. Saw his cardiologist on 11/28/2017, had unremarkable visit. Patient reports that he discussed his chest pain at that visit, was told that it was not cardiac related. He did not have an ecg performed or labs at that time. Was told to follow up in 1 year. Has also had a few episodes of belly pain, associated with 1 day constipation in the past month. Reports having 3 month history of persistent urinary frequency, nocturia, malodorous urine, feeling of incomplete emptying, urinary urgency. Started having intermittent dysuria ~1 month ago, occurs daily. Has had unprotected sex with 1 male since November 2018. Has tried to hydrate more lately. Does not drink coffee. Has not drank alcohol in ~2 months. Does not drink heavily generally. Denies smoking cigarettes.  Review of Systems  Constitutional: Negative for fever.  Gastrointestinal: Negative for blood in stool, diarrhea and vomiting.  Genitourinary: Negative for flank pain and hematuria.       Denies penile discharge, genital rashes.   Bruce Lopez has a current medication list which includes the following prescription(s): lysine hcl and multivitamin with minerals. Also has No Known Allergies.  Bruce Lopez  has a past medical history of Palpitations. Also  has a past surgical history that includes Cardioversion (N/A, 06/22/2017).  Objective:   Vitals: BP 118/76   Pulse 76   Temp 97.9 F (36.6 C) (Oral)   Resp 16   Ht 5\' 10"  (1.778 m)   Wt 134 lb (60.8 kg)   SpO2 100%   BMI 19.23 kg/m   Physical Exam  Constitutional: He is oriented to person, place, and time. He appears  well-developed and well-nourished.  HENT:  Mouth/Throat: Oropharynx is clear and moist.  Eyes: No scleral icterus.  Cardiovascular: Normal rate, regular rhythm and intact distal pulses. Exam reveals no gallop and no friction rub.  No murmur heard. Pulmonary/Chest: No respiratory distress. He has no wheezes. He has no rales.  Abdominal: Soft. Bowel sounds are normal. He exhibits no distension and no mass. There is no tenderness. There is no guarding.  Neurological: He is alert and oriented to person, place, and time.  Skin: Skin is warm and dry.  Psychiatric: He has a normal mood and affect.    Results for orders placed or performed in visit on 12/01/17 (from the past 24 hour(s))  POCT urinalysis dipstick     Status: None   Collection Time: 12/01/17  3:45 PM  Result Value Ref Range   Color, UA yellow yellow   Clarity, UA clear clear   Glucose, UA negative negative mg/dL   Bilirubin, UA negative negative   Ketones, POC UA negative negative mg/dL   Spec Grav, UA [id number] 1.1911.010 - 1.025   Blood, UA negative negative   pH, UA 7.0 5.0 - 8.0   Protein Ur, POC negative negative mg/dL   Urobilinogen, UA 0.2 0.2 or 1.0 E.U./dL   Nitrite, UA Negative Negative   Leukocytes, UA Negative Negative  POCT CBC     Status: Abnormal   Collection Time: 12/01/17  3:48 PM  Result Value Ref Range   WBC 5.7 4.6 - 10.2 K/uL   Lymph,  poc 1.9 0.6 - 3.4   POC LYMPH PERCENT 32.5 10 - 50 %L   MID (cbc) 0.4 0 - 0.9   POC MID % 6.7 0 - 12 %M   POC Granulocyte 3.5 2 - 6.9   Granulocyte percent 60.8 37 - 80 %G   RBC 5.28 4.69 - 6.13 M/uL   Hemoglobin 16.6 14.1 - 18.1 g/dL   HCT, POC 86.5 78.4 - 53.7 %   MCV 93.4 80 - 97 fL   MCH, POC 31.5 (A) 27 - 31.2 pg   MCHC 33.7 31.8 - 35.4 g/dL   RDW, POC 69.6 %   Platelet Count, POC 157 142 - 424 K/uL   MPV 8.7 0 - 99.8 fL  POCT Microscopic Urinalysis ([facility])     Status: Abnormal   Collection Time: 12/01/17  3:52 PM  Result Value Ref Range   WBC,UR,HPF,POC Few (A)  None WBC/hpf   RBC,UR,HPF,POC None None RBC/hpf   Bacteria Few (A) None, Too numerous to count   Mucus Absent Absent   Epithelial Cells, UR Per Microscopy None None, Too numerous to count cells/hpf   ECG interpretation - Sinus rhythm at 70bpm. No acute findings.  Assessment and Plan :   Bad odor of urine - Plan: POCT Microscopic Urinalysis ([facility]), POCT urinalysis dipstick, Urine Culture, Trichomonas vaginalis, RNA, GC/Chlamydia Probe Amp([facility]), RPR, HIV antibody, Comprehensive metabolic panel, US PELVIC DOPPLER (TORSION R/O OR MASS ARTERIAL FLOW)  Dysuria - Plan: POCT Microscopic Urinalysis ([facility]), POCT urinalysis dipstick, POCT CBC, Urine Culture, Trichomonas vaginalis, RNA, GC/Chlamydia Probe Amp([facility]), RPR, HIV antibody, Comprehensive metabolic panel, US PELVIC DOPPLER (TORSION R/O OR MASS ARTERIAL FLOW)  Atypical chest pain - Plan: EKG 12-Lead, Comprehensive metabolic panel  History of atrial fibrillation - Plan: EKG 12-Lead  Abdominal pain, epigastric - Plan: Comprehensive metabolic panel, CANCELED: Lipase, CANCELED: H. pylori breath test  Testicular swelling, left - Plan: US PELVIC DOPPLER (TORSION R/O OR MASS ARTERIAL FLOW)  POC labs and ecg reassuring, will start meloxicam. Labs pending. U/S pending.   Wallis Bamberg, PA-C Primary [facility] at [facility] [phone] 12/01/2017  3:30 PM

## 2017-12-01 NOTE — Patient Instructions (Addendum)
Hydrate well with at least 2 liters (1 gallon) of water daily. You may take 500mg  Tylenol every 6 hours for pain and inflammation. It is okay to take Tylenol and meloxicam at the same time.    Nonspecific Chest Pain Chest pain can be caused by many different conditions. There is always a chance that your pain could be related to something serious, such as a heart attack or a blood clot in your lungs. Chest pain can also be caused by conditions that are not life-threatening. If you have chest pain, it is very important to follow up with your health care provider. What are the causes? Causes of this condition include:  Heartburn.  Pneumonia or bronchitis.  Anxiety or stress.  Inflammation around your heart (pericarditis) or lung (pleuritis or pleurisy).  A blood clot in your lung.  A collapsed lung (pneumothorax). This can develop suddenly on its own (spontaneous pneumothorax) or from trauma to the chest.  Shingles infection (varicella-zoster virus).  Heart attack.  Damage to the bones, muscles, and cartilage that make up your chest wall. This can include: ? Bruised bones due to injury. ? Strained muscles or cartilage due to frequent or repeated coughing or overwork. ? Fracture to one or more ribs. ? Sore cartilage due to inflammation (costochondritis).  What increases the risk? Risk factors for this condition may include:  Activities that increase your risk for trauma or injury to your chest.  Respiratory infections or conditions that cause frequent coughing.  Medical conditions or overeating that can cause heartburn.  Heart disease or family history of heart disease.  Conditions or health behaviors that increase your risk of developing a blood clot.  Having had chicken pox (varicella zoster).  What are the signs or symptoms? Chest pain can feel like:  Burning or tingling on the surface of your chest or deep in your chest.  Crushing, pressure, aching, or squeezing  pain.  Dull or sharp pain that is worse when you move, cough, or take a deep breath.  Pain that is also felt in your back, neck, shoulder, or arm, or pain that spreads to any of these areas.  Your chest pain may come and go, or it may stay constant. How is this diagnosed? Lab tests or other studies may be needed to find the cause of your pain. Your health care provider may have you take a test called an ECG (electrocardiogram). An ECG records your heartbeat patterns at the time the test is performed. You may also have other tests, such as:  Transthoracic echocardiogram (TTE). In this test, sound waves are used to create a picture of the heart structures and to look at how blood flows through your heart.  Transesophageal echocardiogram (TEE).This is a more advanced imaging test that takes images from inside your body. It allows your health care provider to see your heart in finer detail.  Cardiac monitoring. This allows your health care provider to monitor your heart rate and rhythm in real time.  Holter monitor. This is a portable device that records your heartbeat and can help to diagnose abnormal heartbeats. It allows your health care provider to track your heart activity for several days, if needed.  Stress tests. These can be done through exercise or by taking medicine that makes your heart beat more quickly.  Blood tests.  Other imaging tests.  How is this treated? Treatment depends on what is causing your chest pain. Treatment may include:  Medicines. These may include: ? Acid blockers  for heartburn. ? Anti-inflammatory medicine. ? Pain medicine for inflammatory conditions. ? Antibiotic medicine, if an infection is present. ? Medicines to dissolve blood clots. ? Medicines to treat coronary artery disease (CAD).  Supportive care for conditions that do not require medicines. This may include: ? Resting. ? Applying heat or cold packs to injured areas. ? Limiting activities  until pain decreases.  Follow these instructions at home: Medicines  If you were prescribed an antibiotic, take it as told by your health care provider. Do not stop taking the antibiotic even if you start to feel better.  Take over-the-counter and prescription medicines only as told by your health care provider. Lifestyle  Do not use any products that contain nicotine or tobacco, such as cigarettes and e-cigarettes. If you need help quitting, ask your health care provider.  Do not drink alcohol.  Make lifestyle changes as directed by your health care provider. These may include: ? Getting regular exercise. Ask your health care provider to suggest some activities that are safe for you. ? Eating a heart-healthy diet. A registered dietitian can help you to learn healthy eating options. ? Maintaining a healthy weight. ? Managing diabetes, if necessary. ? Reducing stress, such as with yoga or relaxation techniques. General instructions  Avoid any activities that bring on chest pain.  If heartburn is the cause for your chest pain, raise (elevate) the head of your bed about 6 inches (15 cm) by putting blocks under the legs. Sleeping with more pillows does not effectively relieve heartburn because it only changes the position of your head.  Keep all follow-up visits as told by your health care provider. This is important. This includes any further testing if your chest pain does not go away. Contact a health care provider if:  Your chest pain does not go away.  You have a rash with blisters on your chest.  You have a fever.  You have chills. Get help right away if:  Your chest pain is worse.  You have a cough that gets worse, or you cough up blood.  You have severe pain in your abdomen.  You have severe weakness.  You faint.  You have sudden, unexplained chest discomfort.  You have sudden, unexplained discomfort in your arms, back, neck, or jaw.  You have shortness of  breath at any time.  You suddenly start to sweat, or your skin gets clammy.  You feel nauseous or you vomit.  You suddenly feel light-headed or dizzy.  Your heart begins to beat quickly, or it feels like it is skipping beats. These symptoms may represent a serious problem that is an emergency. Do not wait to see if the symptoms will go away. Get medical help right away. Call your local emergency services (911 in the [place] Do not drive yourself to the hospital. This information is not intended to replace advice given to you by your health care provider. Make sure you discuss any questions you have with your health care provider. Document Released: 07/20/2005 Document Revised: 07/04/2016 Document Reviewed: 07/04/2016 Elsevier Interactive Patient Education  2017 [employer] Inc.    Dysuria Dysuria is pain or discomfort while urinating. The pain or discomfort may be felt in the tube that carries urine out of the bladder (urethra) or in the surrounding tissue of the genitals. The pain may also be felt in the groin area, lower abdomen, and lower back. You may have to urinate frequently or have the sudden feeling that you have to urinate (urgency).  Dysuria can affect both men and women, but is more common in women. Dysuria can be caused by many different things, including:  Urinary tract infection in women.  Infection of the kidney or bladder.  Kidney stones or bladder stones.  Certain sexually transmitted infections (STIs), such as chlamydia.  Dehydration.  Inflammation of the vagina.  Use of certain medicines.  Use of certain soaps or scented products that cause irritation.  Follow these instructions at home: Watch your dysuria for any changes. The following actions may help to reduce any discomfort you are feeling:  Drink enough fluid to keep your urine clear or pale yellow.  Empty your bladder often. Avoid holding urine for long periods of time.  After a bowel movement or  urination, women should cleanse from front to back, using each tissue only once.  Empty your bladder after sexual intercourse.  Take medicines only as directed by your health care provider.  If you were prescribed an antibiotic medicine, finish it all even if you start to feel better.  Avoid caffeine, tea, and alcohol. They can irritate the bladder and make dysuria worse. In men, alcohol may irritate the prostate.  Keep all follow-up visits as directed by your health care provider. This is important.  If you had any tests done to find the cause of dysuria, it is your responsibility to obtain your test results. Ask the lab or department performing the test when and how you will get your results. Talk with your health care provider if you have any questions about your results.  Contact a health care provider if:  You develop pain in your back or sides.  You have a fever.  You have nausea or vomiting.  You have blood in your urine.  You are not urinating as often as you usually do. Get help right away if:  You pain is severe and not relieved with medicines.  You are unable to hold down any fluids.  You or someone else notices a change in your mental function.  You have a rapid heartbeat at rest.  You have shaking or chills.  You feel extremely weak. This information is not intended to replace advice given to you by your health care provider. Make sure you discuss any questions you have with your health care provider. Document Released: 07/08/2004 Document Revised: 03/17/2016 Document Reviewed: 06/05/2014 Elsevier Interactive Patient Education  2018 [employer].      IF you received an x-ray today, you will receive an invoice from [facility]. Please contact [facility] at [phone] with questions or concerns regarding your invoice.   IF you received labwork today, you will receive an invoice from [facility]. Please contact [facility] at [phone]  with questions or concerns regarding your invoice.   Our billing staff will not be able to assist you with questions regarding bills from these companies.  You will be contacted with the lab results as soon as they are available. The fastest way to get your results is to activate your My Chart account. Instructions are located on the last page of this paperwork. If you have not heard from [place] regarding the results in 2 weeks, please contact this office.

## 2017-12-02 LAB — COMPREHENSIVE METABOLIC PANEL
ALBUMIN: 4.8 g/dL (ref 3.5–5.5)
ALT: 29 IU/L (ref 0–44)
AST: 23 IU/L (ref 0–40)
Albumin/Globulin Ratio: 1.6 (ref 1.2–2.2)
Alkaline Phosphatase: 48 IU/L (ref 39–117)
BUN/Creatinine Ratio: 17 (ref 9–20)
BUN: 16 mg/dL (ref 6–20)
Bilirubin Total: 2.6 mg/dL — ABNORMAL HIGH (ref 0.0–1.2)
CO2: 24 mmol/L (ref 20–29)
Calcium: 9.7 mg/dL (ref 8.7–10.2)
Chloride: 103 mmol/L (ref 96–106)
Creatinine, Ser: 0.93 mg/dL (ref 0.76–1.27)
GFR calc Af Amer: 129 mL/min/{1.73_m2} (ref >59)
GFR calc non Af Amer: 111 mL/min/{1.73_m2} (ref >59)
GLOBULIN, TOTAL: 3 g/dL (ref 1.5–4.5)
Glucose: 100 mg/dL — ABNORMAL HIGH (ref 65–99)
Potassium: 4.2 mmol/L (ref 3.5–5.2)
SODIUM: 144 mmol/L (ref 134–144)
Total Protein: 7.8 g/dL (ref 6.0–8.5)

## 2017-12-02 LAB — GC/CHLAMYDIA PROBE AMP
Chlamydia trachomatis, NAA: NEGATIVE
Neisseria gonorrhoeae by PCR: NEGATIVE

## 2017-12-02 LAB — HIV ANTIBODY (ROUTINE TESTING W REFLEX): HIV Screen 4th Generation wRfx: NONREACTIVE

## 2017-12-02 LAB — URINE CULTURE: Organism ID, Bacteria: NO GROWTH

## 2017-12-02 LAB — RPR: RPR: NONREACTIVE

## 2017-12-04 LAB — TRICHOMONAS VAGINALIS, PROBE AMP: Trich vag by NAA: NEGATIVE

## 2017-12-08 ENCOUNTER — Encounter: Payer: Self-pay | Admitting: Urgent Care

## 2017-12-08 DIAGNOSIS — R1084 Generalized abdominal pain: Secondary | ICD-10-CM

## 2017-12-14 ENCOUNTER — Telehealth: Payer: Self-pay | Admitting: Urgent Care

## 2017-12-14 DIAGNOSIS — N5089 Other specified disorders of the male genital organs: Secondary | ICD-10-CM

## 2017-12-14 NOTE — Telephone Encounter (Signed)
Order signed. Thank you!

## 2017-12-14 NOTE — Telephone Encounter (Signed)
Incoming call from call center. Spoke with Faby. She is requesting patient's scrotal ultrasound order be changed to correct order. Order pended for provider signature.

## 2017-12-15 NOTE — Telephone Encounter (Signed)
Order placed for both testicular and RUQ U/S.

## 2017-12-19 ENCOUNTER — Ambulatory Visit
Admission: RE | Admit: 2017-12-19 | Discharge: 2017-12-19 | Disposition: A | Discharge status: Home / Self Care | Payer: 59 | Source: Ambulatory Visit | Attending: Urgent Care | Admitting: Urgent Care

## 2017-12-19 ENCOUNTER — Telehealth: Payer: Self-pay | Admitting: Urgent Care

## 2017-12-19 DIAGNOSIS — I861 Scrotal varices: Secondary | ICD-10-CM

## 2017-12-19 DIAGNOSIS — N5089 Other specified disorders of the male genital organs: Secondary | ICD-10-CM

## 2017-12-19 DIAGNOSIS — N433 Hydrocele, unspecified: Secondary | ICD-10-CM | POA: Diagnosis not present

## 2017-12-19 NOTE — Telephone Encounter (Signed)
Testicular large left varicocele. Called report to [facility] and [facility]

## 2017-12-19 NOTE — Telephone Encounter (Addendum)
Incoming call from patient call center, spoke with Maralyn SagoSarah. Patient had US Scrotum with Doppler today, he has a left large varicocele, otherwise normal exam. Provider, Lorain ChildesFYI.

## 2017-12-22 ENCOUNTER — Encounter: Payer: Self-pay | Admitting: Urgent Care

## 2017-12-22 NOTE — Telephone Encounter (Signed)
Referral to urology is pending

## 2017-12-28 ENCOUNTER — Ambulatory Visit
Admission: RE | Admit: 2017-12-28 | Discharge: 2017-12-28 | Disposition: A | Discharge status: Home / Self Care | Payer: 59 | Source: Ambulatory Visit | Attending: Urgent Care | Admitting: Urgent Care

## 2017-12-28 DIAGNOSIS — R109 Unspecified abdominal pain: Secondary | ICD-10-CM | POA: Diagnosis not present

## 2017-12-28 DIAGNOSIS — R1084 Generalized abdominal pain: Secondary | ICD-10-CM

## 2018-02-19 ENCOUNTER — Telehealth: Payer: Self-pay | Admitting: Physician Assistant

## 2018-02-19 NOTE — Telephone Encounter (Signed)
Patient paged the after hour answering service. Per patient, he went into afib around 5PM today. He has very good cardiac awareness of afib. He remain in afib now. HR improved after taking 12.5mg  metoprolol (this Rx was old, although it is no longer on his medication list). He will need to call our office tomorrow to be seen by either Dr. Elease Hashimoto, his APP or afib clinic. If we can convert him within 24-36 hours after onset of afib, we may not need to restart Xarelto which he had taken in the past. I am in favor of trying pill-in-the-pocket Flecainide in this young patient. My only question is whether he has any structural heart disease, doubt in the young patient, previous echo in 05/2017 mentioned  "Mitral valve: Thickening. , consistent with rheumatic disease". Question dictation error with rheumatic disease  As long as his HR < 140 on metoprolol, will try to manage in the clinic. Patient has been instructed to call fist thing tomorrow AM to be added on to cardiology office or afib clinic to be seen tomorrow and consider antiarrhythmic.   Ramond Dial PA Pager: [phone]

## 2018-02-20 ENCOUNTER — Ambulatory Visit (HOSPITAL_COMMUNITY)
Admission: RE | Admit: 2018-02-20 | Discharge: 2018-02-20 | Disposition: A | Discharge status: Home / Self Care | Payer: 59 | Source: Ambulatory Visit | Attending: Nurse Practitioner | Admitting: Nurse Practitioner

## 2018-02-20 ENCOUNTER — Encounter (HOSPITAL_COMMUNITY): Payer: Self-pay | Admitting: Nurse Practitioner

## 2018-02-20 VITALS — BP 122/96 | HR 91 | Ht 70.0 in | Wt 134.0 lb

## 2018-02-20 DIAGNOSIS — Z79899 Other long term (current) drug therapy: Secondary | ICD-10-CM | POA: Diagnosis not present

## 2018-02-20 DIAGNOSIS — I4891 Unspecified atrial fibrillation: Secondary | ICD-10-CM | POA: Diagnosis present

## 2018-02-20 DIAGNOSIS — I481 Persistent atrial fibrillation: Secondary | ICD-10-CM | POA: Diagnosis not present

## 2018-02-20 DIAGNOSIS — Z7901 Long term (current) use of anticoagulants: Secondary | ICD-10-CM | POA: Insufficient documentation

## 2018-02-20 DIAGNOSIS — Z87891 Personal history of nicotine dependence: Secondary | ICD-10-CM | POA: Insufficient documentation

## 2018-02-20 DIAGNOSIS — I480 Paroxysmal atrial fibrillation: Secondary | ICD-10-CM

## 2018-02-20 MED ORDER — RIVAROXABAN 20 MG PO TABS: 20.0000 mg | ORAL_TABLET | Freq: Every day | ORAL | Status: DC

## 2018-02-20 NOTE — Patient Instructions (Signed)
Start Xarelto  once a day with supper - start today Metoprolol 12.5mg  twice a day

## 2018-02-20 NOTE — Telephone Encounter (Signed)
Please see if patient can be seen in the Afib clinic this afternoon .

## 2018-02-20 NOTE — Telephone Encounter (Signed)
Patient has an appointment today in Afib clinic at 3:15 pm

## 2018-02-21 NOTE — Progress Notes (Signed)
Primary Care Physician: Patient, No Pcp Per Referring Physician: Dr. Kasandra Knudsen is a 26 y.o. male with a h/o SVT, followed by Dr. Elease Hashimoto, that was seen in an urgent care clinic 05/2017, for palpitations, found to be in rate controlled afib. He denied any use of caffeine, alcohol, drugs, no sleep apnea.   He felt he was out of rhythm for a week before he went to urgent care. He had EKG with him that very clearly documents afib. He was in rate controlled afib, when first seen in afib clinic 05/29/2017, HR in the 90's. 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 Melburn Popper started xarelto 20 mg a day on 05/29/17 and restarted metoprolol succinate 25 mg 1/2 tab a day.  On last visit, we tried to increase toprol to 25 mg 1/2 bid, to help restore SR,but he did not tolerate due to fatigue.  Unfortunately,f/u in afib clinic 8/20, he had not converted to SR with addtion of BB.   Successful  on cardioversion 8/30. Marland KitchenStill no know triggers. Repeat echo showed rheumatic appearing valve, stable compared to last echo. He denies having rheumatic fever.  F/u in afib clinic 02/20/18. He went back into afib yesterday around 5 pm, while playing kick ball with his youth group.He has taken 12.5 mg metoprolol this am. He is rate controlled. He came off anticoagulation/BB last year after 4 weeks following cardioversion. Drank a couple of beers Friday Pm but still no significant amounts of alcohol.  Today, he denies symptoms of palpitations, chest pain, shortness of breath, orthopnea, PND, lower extremity edema, dizziness, presyncope, syncope, or neurologic sequela. The patient is tolerating medications without difficulties and is otherwise without complaint today.   Past Medical History:  Diagnosis Date  . Palpitations    Past Surgical History:  Procedure Laterality Date  . CARDIOVERSION N/A 06/22/2017   Procedure: CARDIOVERSION;  Surgeon: Elease Hashimoto Deloris Ping, MD;  Location: [facility] ENDOSCOPY;   Service: Cardiovascular;  Laterality: N/A;    Current Outpatient Medications  Medication Sig Dispense Refill  . cetirizine (ZYRTEC) 10 MG tablet Take 10 mg by mouth daily.    Marland Kitchen Lysine HCl (L-FORMULA LYSINE HCL) 500 MG TABS Take 1 tablet by mouth daily.    . metoprolol tartrate (LOPRESSOR) 25 MG tablet Take 12.5 mg by mouth 2 (two) times daily.    . Multiple Vitamin (MULTIVITAMIN WITH MINERALS) TABS tablet Take 1 tablet by mouth daily.     . rivaroxaban (XARELTO) 20 MG TABS tablet Take 1 tablet (20 mg total) by mouth daily with supper. 30 tablet    No current facility-administered medications for this encounter.     No Known Allergies  Social History   Socioeconomic History  . Marital status: Single    Spouse name: Not on file  . Number of children: Not on file  . Years of education: Not on file  . Highest education level: Not on file  Occupational History  . Not on file  Social Needs  . Financial resource strain: Not on file  . Food insecurity:    Worry: Not on file    Inability: Not on file  . Transportation needs:    Medical: Not on file    Non-medical: Not on file  Tobacco Use  . Smoking status: Former Games developer  . Smokeless tobacco: Never Used  Substance and Sexual Activity  . Alcohol use: Yes  . Drug use: No  . Sexual activity: Not on  file  Lifestyle  . Physical activity:    Days per week: Not on file    Minutes per session: Not on file  . Stress: Not on file  Relationships  . Social connections:    Talks on phone: Not on file    Gets together: Not on file    Attends religious service: Not on file    Active member of club or organization: Not on file    Attends meetings of clubs or organizations: Not on file    Relationship status: Not on file  . Intimate partner violence:    Fear of current or ex partner: Not on file    Emotionally abused: Not on file    Physically abused: Not on file    Forced sexual activity: Not on file  Other Topics Concern  . Not on  file  Social History Narrative  . Not on file    Family History  Problem Relation Age of Onset  . Atrial fibrillation Paternal Grandmother     ROS- All systems are reviewed and negative except as per the HPI above  Physical Exam: Vitals:   02/20/18 1531  BP: (!) 122/96  Pulse: 91  Weight: 134 lb (60.8 kg)  Height:  (1.778 m)   Wt Readings from Last 3 Encounters:  02/20/18 134 lb (60.8 kg)  12/01/17 134 lb (60.8 kg)  11/28/17 136 lb 3.2 oz (61.8 kg)    Labs: Lab Results  Component Value Date   NA 144 12/01/2017   K 4.2 12/01/2017   CL 103 12/01/2017   CO2 24 12/01/2017   GLUCOSE 100 (H) 12/01/2017   BUN 16 12/01/2017   CREATININE 0.93 12/01/2017   CALCIUM 9.7 12/01/2017   MG 2.2 06/12/2017   No results found for: INR No results found for: CHOL, HDL, LDLCALC, TRIG   GEN- The patient is well appearing, alert and oriented x 3 today.   Head- normocephalic, atraumatic Eyes-  Sclera clear, conjunctiva pink Ears- hearing intact Oropharynx- clear Neck- supple, no JVP Lymph- no cervical lymphadenopathy Lungs- Clear to ausculation bilaterally, normal work of breathing Heart- irregular rate and rhythm, no murmurs, rubs or gallops, PMI not laterally displaced GI- soft, NT, ND, + BS Extremities- no clubbing, cyanosis, or edema MS- no significant deformity or atrophy Skin- no rash or lesion Psych- euthymic mood, full affect Neuro- strength and sensation are intact  EKG-afib at 84 bpm, qrs int 96 ms, qtc 394 ms Ekg from urgent care also reviewed Echo Study Conclusions  - Left ventricle: The cavity size was normal. Systolic function was   normal. The estimated ejection fraction was in the range of 55%   to 60%. Wall motion was normal; there were no regional wall   motion abnormalities. - Aortic valve: Transvalvular velocity was within the normal range.   There was no stenosis. There was no regurgitation. - Mitral valve: Thickening. , consistent with  rheumatic disease.   Transvalvular velocity was within the normal range. There was no   evidence for stenosis. There was mild regurgitation. - Right ventricle: The cavity size was normal. Wall thickness was   normal. Systolic function was normal. - Tricuspid valve: There was trivial regurgitation. - Pulmonary arteries: Systolic pressure was within the normal   range.    Assessment and Plan: 1.Persistent afib, rate controlled Resume daily metoprolol 12.5 mg bid He has a chadsvasc score of 0, resume xarelto 20 mg daily, started within 24 hours of onset of afib. He is  very clear when it started.  Will schedule for cardioversion  If he des not resume SR on his own. Will have to continue xarleto for 4 weeks after cardioversion but if in SR, will be able to stop after that with chadsvasc score of 0   F/u next week in afib clinic   Donna C. Matthew Folks [facility] [address] [place], [place] [address] [phone]

## 2018-02-26 ENCOUNTER — Ambulatory Visit (HOSPITAL_COMMUNITY)
Admission: RE | Admit: 2018-02-26 | Discharge: 2018-02-26 | Disposition: A | Discharge status: Home / Self Care | Payer: 59 | Source: Ambulatory Visit | Attending: Nurse Practitioner | Admitting: Nurse Practitioner

## 2018-02-26 ENCOUNTER — Encounter (HOSPITAL_COMMUNITY): Payer: Self-pay | Admitting: Nurse Practitioner

## 2018-02-26 VITALS — BP 104/70 | HR 90 | Ht 70.0 in | Wt 136.2 lb

## 2018-02-26 DIAGNOSIS — I481 Persistent atrial fibrillation: Secondary | ICD-10-CM | POA: Insufficient documentation

## 2018-02-26 DIAGNOSIS — Z79899 Other long term (current) drug therapy: Secondary | ICD-10-CM | POA: Diagnosis not present

## 2018-02-26 DIAGNOSIS — I480 Paroxysmal atrial fibrillation: Secondary | ICD-10-CM | POA: Diagnosis not present

## 2018-02-26 DIAGNOSIS — Z87891 Personal history of nicotine dependence: Secondary | ICD-10-CM | POA: Diagnosis not present

## 2018-02-26 DIAGNOSIS — I4819 Other persistent atrial fibrillation: Secondary | ICD-10-CM

## 2018-02-26 LAB — CBC
HCT: 44.6 % (ref 39.0–52.0)
HEMOGLOBIN: 15.6 g/dL (ref 13.0–17.0)
MCH: 32.2 pg (ref 26.0–34.0)
MCHC: 35 g/dL (ref 30.0–36.0)
MCV: 92.1 fL (ref 78.0–100.0)
Platelets: 167 10*3/uL (ref 150–400)
RBC: 4.84 MIL/uL (ref 4.22–5.81)
RDW: 12.3 % (ref 11.5–15.5)
WBC: 5.9 10*3/uL (ref 4.0–10.5)

## 2018-02-26 LAB — BASIC METABOLIC PANEL
Anion gap: 7 (ref 5–15)
BUN: 16 mg/dL (ref 6–20)
CO2: 30 mmol/L (ref 22–32)
CREATININE: 0.98 mg/dL (ref 0.61–1.24)
Calcium: 9.5 mg/dL (ref 8.9–10.3)
Chloride: 105 mmol/L (ref 101–111)
GFR calc Af Amer: >60 mL/min (ref >60)
GFR calc non Af Amer: >60 mL/min (ref >60)
Glucose, Bld: 99 mg/dL (ref 65–99)
Potassium: 4.1 mmol/L (ref 3.5–5.1)
SODIUM: 142 mmol/L (ref 135–145)

## 2018-02-26 MED ORDER — METOPROLOL SUCCINATE ER 25 MG PO TB24: 12.5000 mg | ORAL_TABLET | Freq: Every day | ORAL | 11 refills | Status: DC

## 2018-02-26 NOTE — Progress Notes (Signed)
 Primary Care Physician: Patient, No Pcp Per Referring Physician: Dr. Nahser   Bruce Lopez is a 27 y.o. male with a h/o SVT, followed by Dr. Nahser, that was seen in an urgent care clinic 05/2017, for palpitations, found to be in rate controlled afib. He denied any use of caffeine, alcohol, drugs, no sleep apnea.   He felt he was out of rhythm for a week before he went to urgent care. He had EKG with him that very clearly documents afib. He was in rate controlled afib, when first seen in afib clinic 05/29/2017, HR in the 90's. Works as a youth counselor.  States he is not drinking any alcohol.Dr. Nasher started xarelto 20 mg a day on 05/29/17 and restarted metoprolol succinate 25 mg 1/2 tab a day.  On last visit, we tried to increase toprol to 25 mg 1/2 bid, to help restore SR,but he did not tolerate due to fatigue.  Unfortunately,f/u in afib clinic 06/12/17, he had not converted to SR with addtion of BB.   Successful  on cardioversion 8/30. .Still no know triggers. Repeat echo showed rheumatic appearing valve, stable compared to last echo. He denies having rheumatic fever.  F/u in afib clinic 02/20/18. He went back into afib yesterday around 5 pm, while playing kick ball with his youth group.He has taken 12.5 mg metoprolol this am. He is rate controlled. He came off anticoagulation/BB last year after 4 weeks following cardioversion. Drank a couple of beers Friday Pm but still no significant amounts of alcohol. He restarted xarelto 20 mg daily within 24 hours of returning to afib.  F/u in afib clinic, 5/6. He persistens in afib. He is taking 12.5 mg bid metoprolol succinate and is rate controlled but makes him feel fatigued. We will plan for cardioversion and he will reduce to one half tab daily on cardioversion day.  Today, he denies symptoms of palpitations, chest pain, shortness of breath, orthopnea, PND, lower extremity edema, dizziness, presyncope, syncope, or neurologic sequela. + for fatigue.The  patient is tolerating medications without difficulties and is otherwise without complaint today.   Past Medical History:  Diagnosis Date  . Palpitations    Past Surgical History:  Procedure Laterality Date  . CARDIOVERSION N/A 06/22/2017   Procedure: CARDIOVERSION;  Surgeon: Nahser, Philip J, MD;  Location: [facility] ENDOSCOPY;  Service: Cardiovascular;  Laterality: N/A;    Current Outpatient Medications  Medication Sig Dispense Refill  . cetirizine (ZYRTEC) 10 MG tablet Take 10 mg by mouth daily.    . Lysine HCl (L-FORMULA LYSINE HCL) 500 MG TABS Take 1 tablet by mouth daily.    . Multiple Vitamin (MULTIVITAMIN WITH MINERALS) TABS tablet Take 1 tablet by mouth daily.     . rivaroxaban (XARELTO) 20 MG TABS tablet Take 1 tablet (20 mg total) by mouth daily with supper. 30 tablet   . metoprolol succinate (TOPROL XL) 25 MG 24 hr tablet Take 0.5 tablets (12.5 mg total) by mouth at bedtime. 30 tablet 11   No current facility-administered medications for this encounter.     No Known Allergies  Social History   Socioeconomic History  . Marital status: Single    Spouse name: Not on file  . Number of children: Not on file  . Years of education: Not on file  . Highest education level: Not on file  Occupational History  . Not on file  Social Needs  . Financial resource strain: Not on file  . Food insecurity:    Worry:   Not on file    Inability: Not on file  . Transportation needs:    Medical: Not on file    Non-medical: Not on file  Tobacco Use  . Smoking status: Former Smoker  . Smokeless tobacco: Never Used  Substance and Sexual Activity  . Alcohol use: Yes  . Drug use: No  . Sexual activity: Not on file  Lifestyle  . Physical activity:    Days per week: Not on file    Minutes per session: Not on file  . Stress: Not on file  Relationships  . Social connections:    Talks on phone: Not on file    Gets together: Not on file    Attends religious service: Not on file    Active  member of club or organization: Not on file    Attends meetings of clubs or organizations: Not on file    Relationship status: Not on file  . Intimate partner violence:    Fear of current or ex partner: Not on file    Emotionally abused: Not on file    Physically abused: Not on file    Forced sexual activity: Not on file  Other Topics Concern  . Not on file  Social History Narrative  . Not on file    Family History  Problem Relation Age of Onset  . Atrial fibrillation Paternal Grandmother     ROS- All systems are reviewed and negative except as per the HPI above  Physical Exam: Vitals:   02/26/18 1433  BP: 104/70  Pulse: 90  Weight: 136 lb 3.2 oz (61.8 kg)  Height: 5' 10" (1.778 m)   Wt Readings from Last 3 Encounters:  02/26/18 136 lb 3.2 oz (61.8 kg)  02/20/18 134 lb (60.8 kg)  12/01/17 134 lb (60.8 kg)    Labs: Lab Results  Component Value Date   NA 142 02/26/2018   K 4.1 02/26/2018   CL 105 02/26/2018   CO2 30 02/26/2018   GLUCOSE 99 02/26/2018   BUN 16 02/26/2018   CREATININE 0.98 02/26/2018   CALCIUM 9.5 02/26/2018   MG 2.2 06/12/2017   No results found for: INR No results found for: CHOL, HDL, LDLCALC, TRIG   GEN- The patient is well appearing, alert and oriented x 3 today.   Head- normocephalic, atraumatic Eyes-  Sclera clear, conjunctiva pink Ears- hearing intact Oropharynx- clear Neck- supple, no JVP Lymph- no cervical lymphadenopathy Lungs- Clear to ausculation bilaterally, normal work of breathing Heart- irregular rate and rhythm, no murmurs, rubs or gallops, PMI not laterally displaced GI- soft, NT, ND, + BS Extremities- no clubbing, cyanosis, or edema MS- no significant deformity or atrophy Skin- no rash or lesion Psych- euthymic mood, full affect Neuro- strength and sensation are intact  EKG-afib at 90 bpm, qrs int 94 ms, qtc 401 ms Ekg from urgent care also reviewed Echo Study Conclusions  - Left ventricle: The cavity size was  normal. Systolic function was   normal. The estimated ejection fraction was in the range of 55%   to 60%. Wall motion was normal; there were no regional wall   motion abnormalities. - Aortic valve: Transvalvular velocity was within the normal range.   There was no stenosis. There was no regurgitation. - Mitral valve: Thickening. , consistent with rheumatic disease.   Transvalvular velocity was within the normal range. There was no   evidence for stenosis. There was mild regurgitation. - Right ventricle: The cavity size was normal. Wall thickness was     normal. Systolic function was normal. - Tricuspid valve: There was trivial regurgitation. - Pulmonary arteries: Systolic pressure was within the normal   range.    Assessment and Plan: 1.Persistent afib, rate controlled continue metoprolol xl 12.5 mg bid but reduce to daily on day of cardioversion He has a chadsvasc score of 0, resumed xarelto 20 mg daily, started within 24 hours of onset of afib. He is very clear when it started. Therefore, he should not need 3 weeks of anticoagulation or TEE when cardioverted. No missed doses. 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   F/u  one  week in afib clinic after cardioversion   Bruce Lopez, ANP-C [facility] [address] [place], [place] [address] [phone]  

## 2018-02-26 NOTE — Patient Instructions (Addendum)
Cardioversion scheduled for Friday, May 10th  - Arrive at the [facility] and go to admitting at [facility] not eat or drink anything after midnight the night prior to your procedure.  - Take all your medication with a sip of water prior to arrival.  - You will not be able to drive home after your procedure.

## 2018-02-26 NOTE — H&P (View-Only) (Signed)
Primary Care Physician: Patient, No Pcp Per Referring Physician: Dr. Kasandra Knudsen is a 27 y.o. male with a h/o SVT, followed by Dr. Elease Hashimoto, that was seen in an urgent care clinic 05/2017, for palpitations, found to be in rate controlled afib. He denied any use of caffeine, alcohol, drugs, no sleep apnea.   He felt he was out of rhythm for a week before he went to urgent care. He had EKG with him that very clearly documents afib. He was in rate controlled afib, when first seen in afib clinic 05/29/2017, HR in the 90's. 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 Melburn Popper started xarelto 20 mg a day on 05/29/17 and restarted metoprolol succinate 25 mg 1/2 tab a day.  On last visit, we tried to increase toprol to 25 mg 1/2 bid, to help restore SR,but he did not tolerate due to fatigue.  Unfortunately,f/u in afib clinic 06/12/17, he had not converted to SR with addtion of BB.   Successful  on cardioversion 8/30. Marland KitchenStill no know triggers. Repeat echo showed rheumatic appearing valve, stable compared to last echo. He denies having rheumatic fever.  F/u in afib clinic 02/20/18. He went back into afib yesterday around 5 pm, while playing kick ball with his youth group.He has taken 12.5 mg metoprolol this am. He is rate controlled. He came off anticoagulation/BB last year after 4 weeks following cardioversion. Drank a couple of beers Friday Pm but still no significant amounts of alcohol. He restarted xarelto 20 mg daily within 24 hours of returning to afib.  F/u in afib clinic, 5/6. He persistens in afib. He is taking 12.5 mg bid metoprolol succinate and is rate controlled but makes him feel fatigued. We will plan for cardioversion and he will reduce to one half tab daily on cardioversion day.  Today, he denies symptoms of palpitations, chest pain, shortness of breath, orthopnea, PND, lower extremity edema, dizziness, presyncope, syncope, or neurologic sequela. + for fatigue.The  patient is tolerating medications without difficulties and is otherwise without complaint today.   Past Medical History:  Diagnosis Date  . Palpitations    Past Surgical History:  Procedure Laterality Date  . CARDIOVERSION N/A 06/22/2017   Procedure: CARDIOVERSION;  Surgeon: Elease Hashimoto Deloris Ping, MD;  Location: [facility] ENDOSCOPY;  Service: Cardiovascular;  Laterality: N/A;    Current Outpatient Medications  Medication Sig Dispense Refill  . cetirizine (ZYRTEC) 10 MG tablet Take 10 mg by mouth daily.    Marland Kitchen Lysine HCl (L-FORMULA LYSINE HCL) 500 MG TABS Take 1 tablet by mouth daily.    . Multiple Vitamin (MULTIVITAMIN WITH MINERALS) TABS tablet Take 1 tablet by mouth daily.     . rivaroxaban (XARELTO) 20 MG TABS tablet Take 1 tablet (20 mg total) by mouth daily with supper. 30 tablet   . metoprolol succinate (TOPROL XL) 25 MG 24 hr tablet Take 0.5 tablets (12.5 mg total) by mouth at bedtime. 30 tablet 11   No current facility-administered medications for this encounter.     No Known Allergies  Social History   Socioeconomic History  . Marital status: Single    Spouse name: Not on file  . Number of children: Not on file  . Years of education: Not on file  . Highest education level: Not on file  Occupational History  . Not on file  Social Needs  . Financial resource strain: Not on file  . Food insecurity:    Worry:  Not on file    Inability: Not on file  . Transportation needs:    Medical: Not on file    Non-medical: Not on file  Tobacco Use  . Smoking status: Former Games developer  . Smokeless tobacco: Never Used  Substance and Sexual Activity  . Alcohol use: Yes  . Drug use: No  . Sexual activity: Not on file  Lifestyle  . Physical activity:    Days per week: Not on file    Minutes per session: Not on file  . Stress: Not on file  Relationships  . Social connections:    Talks on phone: Not on file    Gets together: Not on file    Attends religious service: Not on file    Active  member of club or organization: Not on file    Attends meetings of clubs or organizations: Not on file    Relationship status: Not on file  . Intimate partner violence:    Fear of current or ex partner: Not on file    Emotionally abused: Not on file    Physically abused: Not on file    Forced sexual activity: Not on file  Other Topics Concern  . Not on file  Social History Narrative  . Not on file    Family History  Problem Relation Age of Onset  . Atrial fibrillation Paternal Grandmother     ROS- All systems are reviewed and negative except as per the HPI above  Physical Exam: Vitals:   02/26/18 1433  BP: 104/70  Pulse: 90  Weight: 136 lb 3.2 oz (61.8 kg)  Height:  (1.778 m)   Wt Readings from Last 3 Encounters:  02/26/18 136 lb 3.2 oz (61.8 kg)  02/20/18 134 lb (60.8 kg)  12/01/17 134 lb (60.8 kg)    Labs: Lab Results  Component Value Date   NA 142 02/26/2018   K 4.1 02/26/2018   CL 105 02/26/2018   CO2 30 02/26/2018   GLUCOSE 99 02/26/2018   BUN 16 02/26/2018   CREATININE 0.98 02/26/2018   CALCIUM 9.5 02/26/2018   MG 2.2 06/12/2017   No results found for: INR No results found for: CHOL, HDL, LDLCALC, TRIG   GEN- The patient is well appearing, alert and oriented x 3 today.   Head- normocephalic, atraumatic Eyes-  Sclera clear, conjunctiva pink Ears- hearing intact Oropharynx- clear Neck- supple, no JVP Lymph- no cervical lymphadenopathy Lungs- Clear to ausculation bilaterally, normal work of breathing Heart- irregular rate and rhythm, no murmurs, rubs or gallops, PMI not laterally displaced GI- soft, NT, ND, + BS Extremities- no clubbing, cyanosis, or edema MS- no significant deformity or atrophy Skin- no rash or lesion Psych- euthymic mood, full affect Neuro- strength and sensation are intact  EKG-afib at 90 bpm, qrs int 94 ms, qtc 401 ms Ekg from urgent care also reviewed Echo Study Conclusions  - Left ventricle: The cavity size was  normal. Systolic function was   normal. The estimated ejection fraction was in the range of 55%   to 60%. Wall motion was normal; there were no regional wall   motion abnormalities. - Aortic valve: Transvalvular velocity was within the normal range.   There was no stenosis. There was no regurgitation. - Mitral valve: Thickening. , consistent with rheumatic disease.   Transvalvular velocity was within the normal range. There was no   evidence for stenosis. There was mild regurgitation. - Right ventricle: The cavity size was normal. Wall thickness was  normal. Systolic function was normal. - Tricuspid valve: There was trivial regurgitation. - Pulmonary arteries: Systolic pressure was within the normal   range.    Assessment and Plan: 1.Persistent afib, rate controlled continue metoprolol xl 12.5 mg bid but reduce to daily on day of cardioversion He has a chadsvasc score of 0, resumed xarelto 20 mg daily, started within 24 hours of onset of afib. He is very clear when it started. Therefore, he should not need 3 weeks of anticoagulation or TEE when cardioverted. No missed doses. 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   F/u  one  week in afib clinic after cardioversion   Lupita Leash C. Matthew Folks [facility] [address] [place], [place] [address] [phone]

## 2018-03-02 ENCOUNTER — Encounter (HOSPITAL_COMMUNITY): Payer: Self-pay | Admitting: Certified Registered Nurse Anesthetist

## 2018-03-02 ENCOUNTER — Ambulatory Visit (HOSPITAL_COMMUNITY): Payer: 59 | Admitting: Certified Registered Nurse Anesthetist

## 2018-03-02 ENCOUNTER — Ambulatory Visit (HOSPITAL_COMMUNITY)
Admission: RE | Admit: 2018-03-02 | Discharge: 2018-03-02 | Disposition: A | Discharge status: Home / Self Care | Payer: 59 | Source: Ambulatory Visit | Attending: Cardiology | Admitting: Cardiology

## 2018-03-02 ENCOUNTER — Other Ambulatory Visit: Payer: Self-pay

## 2018-03-02 ENCOUNTER — Encounter (HOSPITAL_COMMUNITY)
Admission: RE | Disposition: A | Discharge status: Home / Self Care | Payer: Self-pay | Source: Ambulatory Visit | Attending: Cardiology

## 2018-03-02 DIAGNOSIS — I481 Persistent atrial fibrillation: Secondary | ICD-10-CM | POA: Diagnosis present

## 2018-03-02 DIAGNOSIS — Z87891 Personal history of nicotine dependence: Secondary | ICD-10-CM | POA: Diagnosis not present

## 2018-03-02 DIAGNOSIS — Z7901 Long term (current) use of anticoagulants: Secondary | ICD-10-CM | POA: Insufficient documentation

## 2018-03-02 DIAGNOSIS — I480 Paroxysmal atrial fibrillation: Secondary | ICD-10-CM | POA: Diagnosis not present

## 2018-03-02 HISTORY — PX: CARDIOVERSION: SHX1299

## 2018-03-02 SURGERY — CARDIOVERSION
Anesthesia: General

## 2018-03-02 MED ORDER — ACETAMINOPHEN 325 MG PO TABS: 650.0000 mg | ORAL_TABLET | Freq: Once | ORAL | Status: DC

## 2018-03-02 MED ORDER — PROPOFOL 10 MG/ML IV BOLUS
INTRAVENOUS | Status: DC | PRN
  Administered 2018-03-02 (×2): 40 mg via INTRAVENOUS
  Administered 2018-03-02: 130 mg via INTRAVENOUS
  Administered 2018-03-02: 30 mg via INTRAVENOUS

## 2018-03-02 MED ORDER — SODIUM CHLORIDE 0.9 % IV SOLN
INTRAVENOUS | Status: DC | PRN
  Administered 2018-03-02: 11:00:00 via INTRAVENOUS

## 2018-03-02 MED ORDER — SILVER SULFADIAZINE 1 % EX CREA: TOPICAL_CREAM | CUTANEOUS | 1 refills | Status: DC

## 2018-03-02 NOTE — Discharge Instructions (Signed)
Electrical Cardioversion, Care After °This sheet gives you information about how to care for yourself after your procedure. Your health care provider may also give you more specific instructions. If you have problems or questions, contact your health care provider. °What can I expect after the procedure? °After the procedure, it is common to have: °· Some redness on the skin where the shocks were given. ° °Follow these instructions at home: °· Do not drive for 24 hours if you were given a medicine to help you relax (sedative). °· Take over-the-counter and prescription medicines only as told by your health care provider. °· Ask your health care provider how to check your pulse. Check it often. °· Rest for 48 hours after the procedure or as told by your health care provider. °· Avoid or limit your caffeine use as told by your health care provider. °Contact a health care provider if: °· You feel like your heart is beating too quickly or your pulse is not regular. °· You have a serious muscle cramp that does not go away. °Get help right away if: °· You have discomfort in your chest. °· You are dizzy or you feel faint. °· You have trouble breathing or you are short of breath. °· Your speech is slurred. °· You have trouble moving an arm or leg on one side of your body. °· Your fingers or toes turn cold or blue. °This information is not intended to replace advice given to you by your health care provider. Make sure you discuss any questions you have with your health care provider. °Document Released: 07/31/2013 Document Revised: 05/13/2016 Document Reviewed: 04/15/2016 °Elsevier Interactive Patient Education © 2018 [employer] ° °

## 2018-03-02 NOTE — Anesthesia Postprocedure Evaluation (Signed)
Anesthesia Post Note  Patient: Bruce Lopez  Procedure(s) Performed: CARDIOVERSION (N/A )     Patient location during evaluation: Endoscopy Anesthesia Type: General Level of consciousness: awake and alert Pain management: pain level controlled Vital Signs Assessment: post-procedure vital signs reviewed and stable Respiratory status: spontaneous breathing, nonlabored ventilation, respiratory function stable and patient connected to nasal cannula oxygen Cardiovascular status: blood pressure returned to baseline and stable Postop Assessment: no apparent nausea or vomiting Anesthetic complications: no    Last Vitals:  Vitals:   03/02/18 1110 03/02/18 1120  BP: 115/72 102/74  Pulse: 92 73  Resp: 12 14  Temp:    SpO2: 100% 100%    Last Pain:  Vitals:   03/02/18 1110  TempSrc:   PainSc: 5                  Geryl Dohn

## 2018-03-02 NOTE — Transfer of Care (Signed)
Immediate Anesthesia Transfer of Care Note  Patient: Bruce Lopez  Procedure(s) Performed: CARDIOVERSION (N/A )  Patient Location: Endoscopy Unit  Anesthesia Type:General  Level of Consciousness: awake and alert   Airway & Oxygen Therapy: Patient Spontanous Breathing  Post-op Assessment: Report given to RN, Post -op Vital signs reviewed and stable and Patient moving all extremities X 4  Post vital signs: Reviewed and stable  Last Vitals:  Vitals Value Taken Time  BP    Temp    Pulse    Resp    SpO2      Last Pain:  Vitals:   03/02/18 1021  TempSrc: Oral  PainSc: 0-No pain         Complications: No apparent anesthesia complications

## 2018-03-02 NOTE — Interval H&P Note (Signed)
History and Physical Interval Note:  03/02/2018 10:06 AM  Bruce Lopez  has presented today for surgery, with the diagnosis of AFIB  The various methods of treatment have been discussed with the patient and family. After consideration of risks, benefits and other options for treatment, the patient has consented to  Procedure(s): CARDIOVERSION (N/A) as a surgical intervention .  The patient's history has been reviewed, patient examined, no change in status, stable for surgery.  I have reviewed the patient's chart and labs.  Questions were answered to the patient's satisfaction.     [name]

## 2018-03-02 NOTE — CV Procedure (Signed)
    Cardioversion Note  GODFREY TRITSCHLER [id number] August 26, 1991  Procedure: DC Cardioversion Indications: atrial fibrillation  Procedure Details Consent: Obtained Time Out: Verified patient identification, verified procedure, site/side was marked, verified correct patient position, special equipment/implants available, Radiology Safety Procedures followed,  medications/allergies/relevent history reviewed, required imaging and test results available.  Performed  The patient has been on adequate anticoagulation.  The patient received IV propofol administered by anesthesia staff for sedation.  Synchronous cardioversion was performed at 120, 150 and 200 joules.  The cardioversion was unsuccessful, he would be in SR only for few beats following cardioversion then going back to a-fib.   Complications: Complications of mild skin burn, he was prescribed sulvadine cream. Patient did tolerate procedure well.   [name], MD, [facility] 03/02/2018, 12:29 PM

## 2018-03-02 NOTE — Anesthesia Preprocedure Evaluation (Signed)
Anesthesia Evaluation  Patient identified by MRN, date of birth, ID band Patient awake    Reviewed: Allergy & Precautions, NPO status , Patient's Chart, lab work & pertinent test results  History of Anesthesia Complications Negative for: history of anesthetic complications  Airway Mallampati: II  TM Distance: >3 FB Neck ROM: Full    Dental  (+) Teeth Intact   Pulmonary neg pulmonary ROS, Current Smoker, former smoker,    breath sounds clear to auscultation       Cardiovascular + dysrhythmias Atrial Fibrillation  Rhythm:Irregular Rate:Tachycardia     Neuro/Psych negative neurological ROS  negative psych ROS   GI/Hepatic negative GI ROS, Neg liver ROS,   Endo/Other  negative endocrine ROS  Renal/GU negative Renal ROS     Musculoskeletal   Abdominal   Peds  Hematology negative hematology ROS (+)   Anesthesia Other Findings Echo 8/18  Study Conclusions  - Left ventricle: The cavity size was normal. Systolic function was   normal. The estimated ejection fraction was in the range of 55%   to 60%. Wall motion was normal; there were no regional wall   motion abnormalities. - Aortic valve: Transvalvular velocity was within the normal range.   There was no stenosis. There was no regurgitation. - Mitral valve: Thickening. , consistent with rheumatic disease.   Transvalvular velocity was within the normal range. There was no   evidence for stenosis. There was mild regurgitation. - Right ventricle: The cavity size was normal. Wall thickness was   normal. Systolic function was normal. - Tricuspid valve: There was trivial regurgitation. - Pulmonary arteries: Systolic pressure was within the normal   range.   Reproductive/Obstetrics                             Anesthesia Physical Anesthesia Plan  ASA: II  Anesthesia Plan: General   Post-op Pain Management:    Induction:  Intravenous  PONV Risk Score and Plan: 2 and Treatment may vary due to age or medical condition  Airway Management Planned: Mask  Additional Equipment: None  Intra-op Plan:   Post-operative Plan:   Informed Consent: I have reviewed the patients History and Physical, chart, labs and discussed the procedure including the risks, benefits and alternatives for the proposed anesthesia with the patient or authorized representative who has indicated his/her understanding and acceptance.   Dental advisory given  Plan Discussed with: CRNA and Surgeon  Anesthesia Plan Comments:         Anesthesia Quick Evaluation

## 2018-03-02 NOTE — Anesthesia Procedure Notes (Signed)
Procedure Name: General with mask airway Date/Time: 03/02/2018 10:44 AM Performed by: Nils Pyle, CRNA Pre-anesthesia Checklist: Patient identified, Emergency Drugs available, Suction available and Patient being monitored Patient Re-evaluated:Patient Re-evaluated prior to induction Oxygen Delivery Method: Ambu bag Preoxygenation: Pre-oxygenation with 100% oxygen Induction Type: IV induction Placement Confirmation: positive ETCO2 and breath sounds checked- equal and bilateral Dental Injury: Teeth and Oropharynx as per pre-operative assessment

## 2018-03-04 ENCOUNTER — Encounter (HOSPITAL_COMMUNITY): Payer: Self-pay | Admitting: Cardiology

## 2018-03-05 ENCOUNTER — Ambulatory Visit (HOSPITAL_COMMUNITY): Payer: 59 | Admitting: Nurse Practitioner

## 2018-03-07 ENCOUNTER — Ambulatory Visit (INDEPENDENT_AMBULATORY_CARE_PROVIDER_SITE_OTHER): Payer: 59 | Admitting: Internal Medicine

## 2018-03-07 ENCOUNTER — Encounter: Payer: Self-pay | Admitting: Internal Medicine

## 2018-03-07 VITALS — BP 120/80 | HR 91 | Ht 70.0 in | Wt 135.0 lb

## 2018-03-07 DIAGNOSIS — I481 Persistent atrial fibrillation: Secondary | ICD-10-CM

## 2018-03-07 DIAGNOSIS — I4819 Other persistent atrial fibrillation: Secondary | ICD-10-CM

## 2018-03-07 MED ORDER — FLECAINIDE ACETATE 50 MG PO TABS: 50.0000 mg | ORAL_TABLET | Freq: Two times a day (BID) | ORAL | 0 refills | Status: DC

## 2018-03-07 NOTE — Progress Notes (Signed)
Electrophysiology Office Note   Date:  03/07/2018   ID:  Bruce Lopez, DOB 10/23/1991, MRN [id number]  PCP:  Patient, No Pcp Per  Cardiologist:  Dr Elease Hashimoto Primary Electrophysiologist: Hillis Range, MD    CC: afib   History of Present Illness: Bruce Lopez is a 27 y.o. male who presents today for electrophysiology evaluation.   The patient is referred by Dr Elease Hashimoto and Rudi Coco NP for EP consultation regarding afib.  He has had afib for about a year.  He has required cardioversion previously.  He has not tried AAD therapy.  He has been treated with metoprolol.  He is active.  He reports only rare ETOH.  He avoids caffeine and does not use drugs. Recently, he returned to afib with symptoms of fatigue and palpitations.  He underwent cardioversion which was unsuccessful.   He has occasional back pain.  Today, he denies symptoms of exertional chest pain, orthopnea, PND, lower extremity edema, claudication, dizziness, presyncope, syncope, bleeding, or neurologic sequela. The patient is tolerating medications without difficulties and is otherwise without complaint today.    Past Medical History:  Diagnosis Date  . Persistent atrial fibrillation [facility])    Past Surgical History:  Procedure Laterality Date  . CARDIOVERSION N/A 06/22/2017   Procedure: CARDIOVERSION;  Surgeon: Elease Hashimoto, Deloris Ping, MD;  Location: [facility] ENDOSCOPY;  Service: Cardiovascular;  Laterality: N/A;  . CARDIOVERSION N/A 03/02/2018   Procedure: CARDIOVERSION;  Surgeon: Lars Masson, MD;  Location: [facility] ENDOSCOPY;  Service: Cardiovascular;  Laterality: N/A;     Current Outpatient Medications  Medication Sig Dispense Refill  . metoprolol succinate (TOPROL XL) 25 MG 24 hr tablet Take 0.5 tablets (12.5 mg total) by mouth at bedtime. (Patient taking differently: Take 12.5 mg by mouth 2 (two) times daily. ) 30 tablet 11  . Multiple Vitamin (MULTIVITAMIN WITH MINERALS) TABS tablet Take 1 tablet by mouth daily.     .  rivaroxaban (XARELTO) 20 MG TABS tablet Take 1 tablet (20 mg total) by mouth daily with supper. 30 tablet    No current facility-administered medications for this visit.     Allergies:   Patient has no known allergies.   Social History:  The patient  reports that he has quit smoking. He has never used smokeless tobacco. He reports that he drinks alcohol. He reports that he does not use drugs.   Family History:  The patient's  family history includes Atrial fibrillation in his paternal grandmother.    ROS:  Please see the history of present illness.   All other systems are personally reviewed and negative.    PHYSICAL EXAM: VS:  BP 120/80   Pulse 91   Ht  (1.778 m)   Wt 135 lb (61.2 kg)   BMI 19.37 kg/m  , BMI Body mass index is 19.37 kg/m. GEN: Well nourished, well developed, in no acute distress  HEENT: normal  Neck: no JVD, carotid bruits, or masses Cardiac: iRRR; no murmurs, rubs, or gallops,no edema  Respiratory:  clear to auscultation bilaterally, normal work of breathing GI: soft, nontender, nondistended, + BS MS: no deformity or atrophy  Skin: warm and dry  Neuro:  Strength and sensation are intact Psych: euthymic mood, full affect  EKG:  EKG is ordered today. The ekg ordered today is personally reviewed and shows afib, V rate 91 bpm   Recent Labs: 05/29/2017: TSH 1.379 06/12/2017: Magnesium 2.2 12/01/2017: ALT 29 02/26/2018: BUN 16; Creatinine, Ser 0.98; Hemoglobin 15.6; Platelets 167;  Potassium 4.1; Sodium 142  personally reviewed   Lipid Panel  No results found for: CHOL, TRIG, HDL, CHOLHDL, VLDL, LDLCALC, LDLDIRECT personally reviewed   Wt Readings from Last 3 Encounters:  03/07/18 135 lb (61.2 kg)  03/02/18 136 lb (61.7 kg)  02/26/18 136 lb 3.2 oz (61.8 kg)      Other studies personally reviewed: Additional studies/ records that were reviewed today include: AF clinic notes, prior echo, Dr Calton Dach notes  Review of the above records today  demonstrates: as above   ASSESSMENT AND PLAN:  1.  Persistent afib The patient has symptomatic persistent afib.  chads2vasc score is 0.  He is on xarelto.  He has failed medical therapy with metoprolol.  I have advised flecainide.  Risks of this medicine were discussed at length.   We will start flecainide  BID. Return to AF clinic after 4 doses for ekg.  If still in afib and intervals are ok, would increase flecainide to  BID.   I will see in 2 weeks.  If still in AF at that time, will consider ablation.  Randolm Idol, MD  03/07/2018 4:01 PM     [facility] [address] [place] [address] [phone] (office) [phone] (fax)

## 2018-03-07 NOTE — Patient Instructions (Addendum)
Medication Instructions:  Your physician has recommended you make the following change in your medication: 1.  Start taking flecainide 50 mg one tablet by mouth twice a day.  Labwork: None ordered.  Testing/Procedures: You will go to the Afib clinic this Friday Mar 09, 2018 at 3:00 pm for an EKG.  Follow-Up: Your physician wants you to follow-up in: 2 weeks with Dr. Johney Frame.     Any Other Special Instructions Will Be Listed Below (If Applicable).  If you need a refill on your cardiac medications before your next appointment, please call your pharmacy.   Flecainide tablets What is this medicine? FLECAINIDE (FLEK a nide) is an antiarrhythmic drug. This medicine is used to prevent irregular heart rhythm. It can also slow down fast heartbeats called tachycardia. This medicine may be used for other purposes; ask your health care provider or pharmacist if you have questions. COMMON BRAND NAME(S): Tambocor What should I tell my health care provider before I take this medicine? They need to know if you have any of these conditions: -abnormal levels of potassium in the blood -heart disease including heart rhythm and heart rate problems -kidney or liver disease -recent heart attack -an unusual or allergic reaction to flecainide, local anesthetics, other medicines, foods, dyes, or preservatives -pregnant or trying to get pregnant -breast-feeding How should I use this medicine? Take this medicine by mouth with a glass of water. Follow the directions on the prescription label. You can take this medicine with or without food. Take your doses at regular intervals. Do not take your medicine more often than directed. Do not stop taking this medicine suddenly. This may cause serious, heart-related side effects. If your doctor wants you to stop the medicine, the dose may be slowly lowered over time to avoid any side effects. Talk to your pediatrician regarding the use of this medicine in children.  While this drug may be prescribed for children as young as 1 year of age for selected conditions, precautions do apply. Overdosage: If you think you have taken too much of this medicine contact a poison control center or emergency room at once. NOTE: This medicine is only for you. Do not share this medicine with others. What if I miss a dose? If you miss a dose, take it as soon as you can. If it is almost time for your next dose, take only that dose. Do not take double or extra doses. What may interact with this medicine? Do not take this medicine with any of the following medications: -amoxapine -arsenic trioxide -certain antibiotics like clarithromycin, erythromycin, gatifloxacin, gemifloxacin, levofloxacin, moxifloxacin, sparfloxacin, or troleandomycin -certain antidepressants called tricyclic antidepressants like amitriptyline, imipramine, or nortriptyline -certain medicines to control heart rhythm like disopyramide, dofetilide, encainide, moricizine, procainamide, propafenone, and quinidine -cisapride -cyclobenzaprine -delavirdine -droperidol -haloperidol -hawthorn -imatinib -levomethadyl -maprotiline -medicines for malaria like chloroquine and halofantrine -pentamidine -phenothiazines like chlorpromazine, mesoridazine, prochlorperazine, thioridazine -pimozide -quinine -ranolazine -ritonavir -sertindole -ziprasidone This medicine may also interact with the following medications: -cimetidine -medicines for angina or high blood pressure -medicines to control heart rhythm like amiodarone and digoxin This list may not describe all possible interactions. Give your health care provider a list of all the medicines, herbs, non-prescription drugs, or dietary supplements you use. Also tell them if you smoke, drink alcohol, or use illegal drugs. Some items may interact with your medicine. What should I watch for while using this medicine? Visit your doctor or health care professional  for regular checks on your progress. Because your condition  and the use of this medicine carries some risk, it is a good idea to carry an identification card, necklace or bracelet with details of your condition, medications and doctor or health care professional. Check your blood pressure and pulse rate regularly. Ask your health care professional what your blood pressure and pulse rate should be, and when you should contact him or her. Your doctor or health care professional also may schedule regular blood tests and electrocardiograms to check your progress. You may get drowsy or dizzy. Do not drive, use machinery, or do anything that needs mental alertness until you know how this medicine affects you. Do not stand or sit up quickly, especially if you are an older patient. This reduces the risk of dizzy or fainting spells. Alcohol can make you more dizzy, increase flushing and rapid heartbeats. Avoid alcoholic drinks. What side effects may I notice from receiving this medicine? Side effects that you should report to your doctor or health care professional as soon as possible: -chest pain, continued irregular heartbeats -difficulty breathing -swelling of the legs or feet -trembling, shaking -unusually weak or tired Side effects that usually do not require medical attention (report to your doctor or health care professional if they continue or are bothersome): -blurred vision -constipation -headache -nausea, vomiting -stomach pain This list may not describe all possible side effects. Call your doctor for medical advice about side effects. You may report side effects to FDA at 1-800-FDA-1088. Where should I keep my medicine? Keep out of the reach of children. Store at room temperature between 15 and 30 degrees C (59 and 86 degrees F). Protect from light. Keep container tightly closed. Throw away any unused medicine after the expiration date. NOTE: This sheet is a summary. It may not cover all  possible information. If you have questions about this medicine, talk to your doctor, pharmacist, or health care provider.  2018 Elsevier/Gold Standard (2008-02-13 16:46:09)

## 2018-03-09 ENCOUNTER — Ambulatory Visit (HOSPITAL_COMMUNITY)
Admission: RE | Admit: 2018-03-09 | Discharge: 2018-03-09 | Disposition: A | Discharge status: Home / Self Care | Payer: 59 | Source: Ambulatory Visit | Attending: Nurse Practitioner | Admitting: Nurse Practitioner

## 2018-03-09 DIAGNOSIS — R9431 Abnormal electrocardiogram [ECG] [EKG]: Secondary | ICD-10-CM | POA: Insufficient documentation

## 2018-03-09 DIAGNOSIS — I4891 Unspecified atrial fibrillation: Secondary | ICD-10-CM | POA: Diagnosis not present

## 2018-03-09 MED ORDER — FLECAINIDE ACETATE 50 MG PO TABS: 100.0000 mg | ORAL_TABLET | Freq: Two times a day (BID) | ORAL | 0 refills | Status: DC

## 2018-03-09 NOTE — Patient Instructions (Signed)
Increase flecainide 100mg twice a day 

## 2018-03-09 NOTE — Progress Notes (Signed)
Patient in for EKG after starting flecainide  twice a day. Pt states he feels to be tolerating the drug well. Felt " normal" for a little bit this morning but back to feeling in afib this afternoon. EKG confirmed with Gypsy Balsam NP -- will increase flecainide to  BID and follow up with Dr. Johney Frame as scheduled.

## 2018-03-13 ENCOUNTER — Ambulatory Visit (HOSPITAL_COMMUNITY): Payer: 59 | Admitting: Nurse Practitioner

## 2018-03-14 ENCOUNTER — Telehealth (HOSPITAL_COMMUNITY): Payer: Self-pay | Admitting: *Deleted

## 2018-03-14 MED ORDER — METOPROLOL SUCCINATE ER 25 MG PO TB24: 12.5000 mg | ORAL_TABLET | Freq: Every day | ORAL | Status: DC

## 2018-03-14 MED ORDER — FLECAINIDE ACETATE 100 MG PO TABS: 100.0000 mg | ORAL_TABLET | Freq: Two times a day (BID) | ORAL | 2 refills | Status: DC

## 2018-03-14 NOTE — Telephone Encounter (Signed)
Patient called in stating he converted back to NSR on Monday a few palpitations here or there but feels he is staying in rhythm. He does state since converting his BP is running in the 92/60 range with with HR right around 60. Will decrease his metoprolol back down to 1/2 tablet once a day (12.5mg ) per Rudi Coco, NP. Continue flecainide and follows up with Dr. Johney Frame next week. Pt in agreement.

## 2018-03-21 ENCOUNTER — Ambulatory Visit (INDEPENDENT_AMBULATORY_CARE_PROVIDER_SITE_OTHER): Payer: 59 | Admitting: Internal Medicine

## 2018-03-21 ENCOUNTER — Encounter: Payer: Self-pay | Admitting: Internal Medicine

## 2018-03-21 VITALS — BP 112/58 | HR 52 | Ht 70.0 in | Wt 135.0 lb

## 2018-03-21 DIAGNOSIS — I4819 Other persistent atrial fibrillation: Secondary | ICD-10-CM

## 2018-03-21 DIAGNOSIS — I481 Persistent atrial fibrillation: Secondary | ICD-10-CM

## 2018-03-21 MED ORDER — METOPROLOL SUCCINATE ER 25 MG PO TB24: 12.5000 mg | ORAL_TABLET | Freq: Every day | ORAL | 3 refills | Status: DC

## 2018-03-21 NOTE — Progress Notes (Signed)
   PCP: Patient, No Pcp Per Primary Cardiologist: Dr Elease Hashimoto Primary EP: Dr Johney Frame  Trinidad Curet is a 27 y.o. male who presents today for routine electrophysiology followup.  Since last being seen in our clinic, the patient reports doing very well. He converted to sinus 03/13/18 with increased flecainide.  He is very pleased with current health state.  He has been in sinus since that time. Today, he denies symptoms of palpitations, chest pain, shortness of breath,  lower extremity edema, dizziness, presyncope, or syncope.  The patient is otherwise without complaint today.   Past Medical History:  Diagnosis Date  . Persistent atrial fibrillation [facility])    Past Surgical History:  Procedure Laterality Date  . CARDIOVERSION N/A 06/22/2017   Procedure: CARDIOVERSION;  Surgeon: Elease Hashimoto Deloris Ping, MD;  Location: [facility] ENDOSCOPY;  Service: Cardiovascular;  Laterality: N/A;  . CARDIOVERSION N/A 03/02/2018   Procedure: CARDIOVERSION;  Surgeon: Lars Masson, MD;  Location: [facility] ENDOSCOPY;  Service: Cardiovascular;  Laterality: N/A;    ROS- all systems are reviewed and negatives except as per HPI above  Current Outpatient Medications  Medication Sig Dispense Refill  . flecainide (TAMBOCOR) 100 MG tablet Take 1 tablet (100 mg total) by mouth 2 (two) times daily. 60 tablet 2  . metoprolol succinate (TOPROL XL) 25 MG 24 hr tablet Take 0.5 tablets (12.5 mg total) by mouth at bedtime.    . Multiple Vitamin (MULTIVITAMIN WITH MINERALS) TABS tablet Take 1 tablet by mouth daily.     . rivaroxaban (XARELTO) 20 MG TABS tablet Take 1 tablet (20 mg total) by mouth daily with supper. 30 tablet    No current facility-administered medications for this visit.     Physical Exam: Vitals:   03/21/18 0829  BP: (!) 112/58  Pulse: (!) 52  Weight: 135 lb (61.2 kg)  Height:  (1.778 m)    GEN- The patient is well appearing, alert and oriented x 3 today.   Head- normocephalic, atraumatic Eyes-  Sclera clear,  conjunctiva pink Ears- hearing intact Oropharynx- clear Lungs- Clear to ausculation bilaterally, normal work of breathing Heart- Regular rate and rhythm, no murmurs, rubs or gallops, PMI not laterally displaced GI- soft, NT, ND, + BS Extremities- no clubbing, cyanosis, or edema  Wt Readings from Last 3 Encounters:  03/21/18 135 lb (61.2 kg)  03/07/18 135 lb (61.2 kg)  03/02/18 136 lb (61.7 kg)    EKG tracing ordered today is personally reviewed and shows sinus rhythm 52 bpm, PR 178 msec, QRS 110 msec, Qtc 414 msec  Assessment and Plan:  1. Persistent afib Now in sinus rhythm with flecainide chads2vasc score is 0.  On xarelto. Stop xarelto 04/13/18 Will obtain ETT on flecainide and toprol If he has additional afib or does not tolerate flecainide/ metoprolol then we will proceed with ablation  Return in 3 months Follow-up in AF clinic with any problems in the interim  Hillis Range MD, [facility] 03/21/2018 8:40 AM

## 2018-03-21 NOTE — Patient Instructions (Addendum)
Medication Instructions:  Your physician recommends that you continue on your current medications as directed. Please refer to the Current Medication list given to you today.  ---You may Stop your Xarelto on 04/13/18---  Labwork: None ordered  Testing/Procedures: Your physician has requested that you have an exercise tolerance test. For further information please visit [email or web address] Please also follow instruction sheet, as given.   Follow-Up: Your physician recommends that you schedule a follow-up appointment in: 3 months with Dr. Johney Frame    Any Other Special Instructions Will Be Listed Below (If Applicable).     If you need a refill on your cardiac medications before your next appointment, please call your pharmacy.

## 2018-03-30 ENCOUNTER — Ambulatory Visit (INDEPENDENT_AMBULATORY_CARE_PROVIDER_SITE_OTHER): Payer: 59

## 2018-03-30 DIAGNOSIS — I481 Persistent atrial fibrillation: Secondary | ICD-10-CM

## 2018-03-30 DIAGNOSIS — I4819 Other persistent atrial fibrillation: Secondary | ICD-10-CM

## 2018-03-30 LAB — EXERCISE TOLERANCE TEST
CHL CUP RESTING HR STRESS: 54 {beats}/min
CSEPEW: 14.4 METS
Exercise duration (min): 12 min
Exercise duration (sec): 35 s
MPHR: 191 {beats}/min
Peak HR: 162 {beats}/min
Percent HR: 85 %
RPE: 17

## 2018-06-18 ENCOUNTER — Encounter: Payer: Self-pay | Admitting: Internal Medicine

## 2018-06-18 ENCOUNTER — Ambulatory Visit: Payer: 59 | Admitting: Internal Medicine

## 2018-06-18 VITALS — BP 104/68 | HR 58 | Ht 70.0 in | Wt 140.8 lb

## 2018-06-18 DIAGNOSIS — I481 Persistent atrial fibrillation: Secondary | ICD-10-CM

## 2018-06-18 DIAGNOSIS — I4819 Other persistent atrial fibrillation: Secondary | ICD-10-CM

## 2018-06-18 NOTE — Progress Notes (Signed)
   PCP: Patient, No Pcp Per Primary Cardiologist: Dr Elease HashimotoNahser Primary EP: Dr Johney FrameAllred  Bruce Lopez is a 27 y.o. male who presents today for routine electrophysiology followup.  Since last being seen in our clinic, the patient reports doing very well. No afib since his last visit.  Tolerating flecainide and toprol without difficulty.  He exercises regularly without exertional symptoms. Today, he denies symptoms of palpitations, exertional chest pain, shortness of breath,  lower extremity edema, dizziness, presyncope, or syncope.  The patient is otherwise without complaint today.   Past Medical History:  Diagnosis Date  . Persistent atrial fibrillation [facility](HCC)    Past Surgical History:  Procedure Laterality Date  . CARDIOVERSION N/A 06/22/2017   Procedure: CARDIOVERSION;  Surgeon: Elease HashimotoNahser, Deloris PingPhilip J, MD;  [facility];  Service: Cardiovascular;  Laterality: N/A;  . CARDIOVERSION N/A 03/02/2018   Procedure: CARDIOVERSION;  Surgeon: Lars MassonNelson, Katarina H, MD;  [facility];  Service: Cardiovascular;  Laterality: N/A;    ROS- all systems are reviewed and negatives except as per HPI above  Current Outpatient Medications  Medication Sig Dispense Refill  . flecainide (TAMBOCOR) 100 MG tablet Take 1 tablet (100 mg total) by mouth 2 (two) times daily. 60 tablet 2  . metoprolol succinate (TOPROL XL) 25 MG 24 hr tablet Take 0.5 tablets (12.5 mg total) by mouth at bedtime. 45 tablet 3  . Multiple Vitamin (MULTIVITAMIN WITH MINERALS) TABS tablet Take 1 tablet by mouth daily.      No current facility-administered medications for this visit.     Physical Exam: Vitals:   06/18/18 1425  BP: 104/68  Pulse: (!) 58  SpO2: 98%  Weight: 140 lb 12.8 oz (63.9 kg)  Height: 5\' 10"  (1.778 m)    GEN- The patient is well appearing, alert and oriented x 3 today.   Head- normocephalic, atraumatic Eyes-  Sclera clear, conjunctiva pink Ears- hearing intact Oropharynx- clear Lungs- Clear to  ausculation bilaterally, normal work of breathing Heart- Regular rate and rhythm, no murmurs, rubs or gallops, PMI not laterally displaced GI- soft, NT, ND, + BS Extremities- no clubbing, cyanosis, or edema  Wt Readings from Last 3 Encounters:  06/18/18 140 lb 12.8 oz (63.9 kg)  03/21/18 135 lb (61.2 kg)  03/07/18 135 lb (61.2 kg)    EKG tracing ordered today is personally reviewed and shows sinus rhythm 58 bpm, PR 168 msec, QRS 108 msec, Qtc 408 msec  Assessment and Plan:  1. Persistent afib chads2vasc score is 0.  Not on OAC at this time. Tolerating flecainide well. Consider ablation if he has further afib.  ETT reviewed today.  Return in 4 months to AF clinic  Hillis RangeJames Atoya Andrew MD, [facility] 06/18/2018 2:37 PM

## 2018-06-18 NOTE — Patient Instructions (Signed)
Medication Instructions:  Your physician recommends that you continue on your current medications as directed. Please refer to the Current Medication list given to you today.  * If you need a refill on your cardiac medications before your next appointment, please call your pharmacy.   Labwork: None ordered  Testing/Procedures: None ordered  Follow-Up: Your physician recommends that you schedule a follow-up appointment in: 4 months Rudi Cocoonna Carroll, NP in the AFib clinic.   Thank you for choosing [facility]!!

## 2018-06-20 ENCOUNTER — Other Ambulatory Visit (HOSPITAL_COMMUNITY): Payer: Self-pay | Admitting: Nurse Practitioner

## 2018-06-20 NOTE — Telephone Encounter (Signed)
This is a A-Fib clinic pt 

## 2018-09-02 ENCOUNTER — Emergency Department (HOSPITAL_BASED_OUTPATIENT_CLINIC_OR_DEPARTMENT_OTHER): Payer: 59

## 2018-09-02 ENCOUNTER — Other Ambulatory Visit: Payer: Self-pay

## 2018-09-02 ENCOUNTER — Emergency Department (HOSPITAL_BASED_OUTPATIENT_CLINIC_OR_DEPARTMENT_OTHER)
Admission: EM | Admit: 2018-09-02 | Discharge: 2018-09-02 | Disposition: A | Discharge status: Home / Self Care | Payer: 59 | Attending: Emergency Medicine | Admitting: Emergency Medicine

## 2018-09-02 ENCOUNTER — Encounter (HOSPITAL_BASED_OUTPATIENT_CLINIC_OR_DEPARTMENT_OTHER): Payer: Self-pay | Admitting: Emergency Medicine

## 2018-09-02 DIAGNOSIS — R1033 Periumbilical pain: Secondary | ICD-10-CM | POA: Diagnosis not present

## 2018-09-02 DIAGNOSIS — R1032 Left lower quadrant pain: Secondary | ICD-10-CM | POA: Diagnosis not present

## 2018-09-02 DIAGNOSIS — R1031 Right lower quadrant pain: Secondary | ICD-10-CM | POA: Diagnosis not present

## 2018-09-02 DIAGNOSIS — Z79899 Other long term (current) drug therapy: Secondary | ICD-10-CM | POA: Insufficient documentation

## 2018-09-02 DIAGNOSIS — R1030 Lower abdominal pain, unspecified: Secondary | ICD-10-CM | POA: Diagnosis not present

## 2018-09-02 DIAGNOSIS — R1909 Other intra-abdominal and pelvic swelling, mass and lump: Secondary | ICD-10-CM | POA: Diagnosis not present

## 2018-09-02 HISTORY — DX: Anxiety disorder, unspecified: F41.9

## 2018-09-02 LAB — COMPREHENSIVE METABOLIC PANEL
ALT: 25 U/L (ref 0–44)
AST: 29 U/L (ref 15–41)
Albumin: 4.7 g/dL (ref 3.5–5.0)
Alkaline Phosphatase: 44 U/L (ref 38–126)
Anion gap: 12 (ref 5–15)
BILIRUBIN TOTAL: 1.9 mg/dL — AB (ref 0.3–1.2)
BUN: 26 mg/dL — AB (ref 6–20)
CALCIUM: 9.9 mg/dL (ref 8.9–10.3)
CO2: 23 mmol/L (ref 22–32)
CREATININE: 0.82 mg/dL (ref 0.61–1.24)
Chloride: 103 mmol/L (ref 98–111)
GFR calc Af Amer: >60 mL/min (ref >60)
Glucose, Bld: 163 mg/dL — ABNORMAL HIGH (ref 70–99)
POTASSIUM: 3.4 mmol/L — AB (ref 3.5–5.1)
Sodium: 138 mmol/L (ref 135–145)
TOTAL PROTEIN: 8.1 g/dL (ref 6.5–8.1)

## 2018-09-02 LAB — CBC
HCT: 46.3 % (ref 39.0–52.0)
Hemoglobin: 16 g/dL (ref 13.0–17.0)
MCH: 31.6 pg (ref 26.0–34.0)
MCHC: 34.6 g/dL (ref 30.0–36.0)
MCV: 91.3 fL (ref 80.0–100.0)
PLATELETS: 162 10*3/uL (ref 150–400)
RBC: 5.07 MIL/uL (ref 4.22–5.81)
RDW: 11.7 % (ref 11.5–15.5)
WBC: 7.8 10*3/uL (ref 4.0–10.5)
nRBC: 0 % (ref 0.0–0.2)

## 2018-09-02 LAB — URINALYSIS, ROUTINE W REFLEX MICROSCOPIC
BILIRUBIN URINE: NEGATIVE
Glucose, UA: NEGATIVE mg/dL
Hgb urine dipstick: NEGATIVE
KETONES UR: NEGATIVE mg/dL
Leukocytes, UA: NEGATIVE
NITRITE: NEGATIVE
PROTEIN: NEGATIVE mg/dL
Specific Gravity, Urine: 1.025 (ref 1.005–1.030)
pH: 7 (ref 5.0–8.0)

## 2018-09-02 LAB — LIPASE, BLOOD: Lipase: 49 U/L (ref 11–51)

## 2018-09-02 MED ORDER — IOPAMIDOL (ISOVUE-300) INJECTION 61%
100.0000 mL | Freq: Once | INTRAVENOUS | Status: AC | PRN
  Administered 2018-09-02: 100 mL via INTRAVENOUS

## 2018-09-02 MED ORDER — ONDANSETRON HCL 4 MG/2ML IJ SOLN
4.0000 mg | Freq: Once | INTRAMUSCULAR | Status: AC
  Administered 2018-09-02: 4 mg via INTRAVENOUS
  Filled 2018-09-02: qty 2

## 2018-09-02 MED ORDER — MORPHINE SULFATE (PF) 4 MG/ML IV SOLN
4.0000 mg | Freq: Once | INTRAVENOUS | Status: AC
  Administered 2018-09-02: 4 mg via INTRAVENOUS
  Filled 2018-09-02: qty 1

## 2018-09-02 NOTE — Discharge Instructions (Addendum)
Your lab work is reassuring today.  Your CT scan is normal.   You can take ibuprofen or Tylenol as needed for pain. Return to the emergency department if your pain worsens, if you develop fever, or uncontrollable vomiting.

## 2018-09-02 NOTE — ED Triage Notes (Signed)
Pt c/o low abdominal pain for past 1 hour. Pt reports for past week swollen hard area on left side.

## 2018-09-02 NOTE — ED Notes (Signed)
Patient transported to CT 

## 2018-09-02 NOTE — ED Provider Notes (Signed)
[facility] EMERGENCY DEPARTMENT Provider Note   CSN: [id number] Arrival date & time: 09/02/18  1725     History   Chief Complaint Chief Complaint  Patient presents with  . Abdominal Pain    HPI Bruce Lopez is a 27 y.o. male with past medical history of controlled atrial fibrillation, anxiety, presenting to the emergency department complaint of a acute onset of periumbilical/suprapubic abdominal pain that began 1 hour prior to arrival.  He states pain is aching and sometimes sharper when he moves.  States he reports to the emergency department because he is never felt pain like this in the past.  He denies associated nausea, vomiting, diarrhea, constipation, pain with defecation..  No medications tried prior to arrival.  Does also mention a small mass to the left groin that has been waxing and waning with pain x1 week.  He does have some pain with that when he stretches his abdomen and felt a small lump however the lump is not currently there and he is asymptomatic.  No history of abdominal surgeries.  He has a history of atrial fibrillation however is not on blood thinners.  He is rate controlled with medications and in a normal rhythm.  He denies any new urinary symptoms, however states he has had increased urinary frequency x2 to 3 months which is been evaluated by his primary care doctor.  The history is provided by the patient.    Past Medical History:  Diagnosis Date  . Anxiety   . Persistent atrial fibrillation     Patient Active Problem List   Diagnosis Date Noted  . Paroxysmal atrial fibrillation (HCC) 06/27/2017  . Persistent atrial fibrillation   . Sinus tachycardia 10/31/2014  . Chest pain 10/31/2014    Past Surgical History:  Procedure Laterality Date  . CARDIOVERSION N/A 06/22/2017   Procedure: CARDIOVERSION;  Surgeon: Elease Hashimoto Deloris Ping, MD;  Location: [facility] ENDOSCOPY;  Service: Cardiovascular;  Laterality: N/A;  . CARDIOVERSION N/A 03/02/2018   Procedure: CARDIOVERSION;  Surgeon: Lars Masson, MD;  Location: [facility] ENDOSCOPY;  Service: Cardiovascular;  Laterality: N/A;        Home Medications    Prior to Admission medications   Medication Sig Start Date End Date Taking? Authorizing Provider  flecainide (TAMBOCOR) 100 MG tablet TAKE 1 TABLET BY MOUTH TWICE A DAY 06/20/18   Newman Nip, NP  metoprolol succinate (TOPROL-XL) 25 MG 24 hr tablet Take 0.5 tablets (12.5 mg total) by mouth at bedtime. 06/20/18   Allred, Fayrene Fearing, MD  Multiple Vitamin (MULTIVITAMIN WITH MINERALS) TABS tablet Take 1 tablet by mouth daily.     [provider]    Family History Family History  Problem Relation Age of Onset  . Atrial fibrillation Paternal Grandmother     Social History Social History   Tobacco Use  . Smoking status: Never Smoker  . Smokeless tobacco: Never Used  Substance Use Topics  . Alcohol use: Not Currently  . Drug use: No     Allergies   Patient has no known allergies.   Review of Systems Review of Systems  Constitutional: Negative for fever.  Gastrointestinal: Positive for abdominal pain. Negative for constipation, diarrhea, nausea and vomiting.  Genitourinary: Positive for frequency. Negative for dysuria and testicular pain.  All other systems reviewed and are negative.    Physical Exam Updated Vital Signs BP 107/73 (BP Location: Left Arm)   Pulse (!) 59   Temp 98.2 F (36.8 C) (Oral)   Resp 20  Ht 5\' 10"  (1.778 m)   Wt 63.5 kg   SpO2 95%   BMI 20.09 kg/m   Physical Exam  Constitutional: He appears well-developed and well-nourished.  Non-toxic appearance. No distress.  HENT:  Head: Normocephalic and atraumatic.  Eyes: Conjunctivae are normal.  Cardiovascular: Normal rate, regular rhythm and normal heart sounds.  Pulmonary/Chest: Effort normal and breath sounds normal. No respiratory distress.  Abdominal: Soft. Bowel sounds are normal. He exhibits no distension and no mass. There is  tenderness in the right lower quadrant, periumbilical area, suprapubic area and left lower quadrant. There is guarding. There is no rebound. No hernia.  Patient with tenderness in the periumbilical and left lower quadrants though worse in the right lower quadrant.  There is no palpated lump or mass in the area that patient describes.  It is not in the groin or inguinal region, it is superior to the inguinal canal, as pictured.  Neurological: He is alert.  Skin: Skin is warm.  Psychiatric: He has a normal mood and affect. His behavior is normal.  Nursing note and vitals reviewed.    ED Treatments / Results  Labs (all labs ordered are listed, but only abnormal results are displayed) Labs Reviewed  COMPREHENSIVE METABOLIC PANEL - Abnormal; Notable for the following components:      Result Value   Potassium 3.4 (*)    Glucose, Bld 163 (*)    BUN 26 (*)    Total Bilirubin 1.9 (*)    All other components within normal limits  LIPASE, BLOOD  CBC  URINALYSIS, ROUTINE W REFLEX MICROSCOPIC    EKG None  Radiology Ct Abdomen Pelvis W Contrast  Result Date: 09/02/2018 CLINICAL DATA:  Acute lower abdominal pain. EXAM: CT ABDOMEN AND PELVIS WITH CONTRAST TECHNIQUE: Multidetector CT imaging of the abdomen and pelvis was performed using the standard protocol following bolus administration of intravenous contrast. CONTRAST:  ISOVUE-300 IOPAMIDOL (ISOVUE-300) INJECTION 61% COMPARISON:  None. FINDINGS: Lower chest: No acute abnormality. Hepatobiliary: No focal liver abnormality is seen. No gallstones, gallbladder wall thickening, or biliary dilatation. Pancreas: Unremarkable. No pancreatic ductal dilatation or surrounding inflammatory changes. Spleen: Normal in size without focal abnormality. Adrenals/Urinary Tract: Adrenal glands are unremarkable. Kidneys are normal, without renal calculi, focal lesion, or hydronephrosis. Bladder is unremarkable. Stomach/Bowel: Stomach is within normal limits.  Appendix appears normal. No evidence of bowel wall thickening, distention, or inflammatory changes. Vascular/Lymphatic: No significant vascular findings are present. No enlarged abdominal or pelvic lymph nodes. Reproductive: Prostate is unremarkable. Other: No abdominal wall hernia or abnormality. No abdominopelvic ascites. Musculoskeletal: No acute or significant osseous findings. IMPRESSION: No abnormality seen in the abdomen or pelvis. Electronically Signed   By: Lupita Raider, M.D.   On: 09/02/2018 19:34    Procedures Procedures (including critical care time)  Medications Ordered in ED Medications  morphine 4 MG/ML injection 4 mg (4 mg Intravenous Given 09/02/18 1837)  ondansetron (ZOFRAN) injection 4 mg (4 mg Intravenous Given 09/02/18 1835)  iopamidol (ISOVUE-300) 61 % injection 100 mL (100 mLs Intravenous Contrast Given 09/02/18 1843)     Initial Impression / Assessment and Plan / ED Course  I have reviewed the triage vital signs and the nursing notes.  Pertinent labs & imaging results that were available during my care of the patient were reviewed by me and considered in my medical decision making (see chart for details).  Clinical Course as of Sep 03 2007  Wynelle Link Sep 02, 2018  2002 Pt re-evaluated, reports improvement  in pain. Abdomen is soft without guarding, still tender in lower abdomen. Though appears well and not in distress.  Discussed reassuring CT results and labs.  Discussed strict return precautions should symptoms worsen.  Agreeable plan and safe for discharge.   [JR]    Clinical Course User Index [JR] Khadija Thier, [place] N, PA-C    Patient presenting with acute onset of periumbilical abdominal pain that occurred about 1 hour prior to arrival.  On exam he is significantly tender in the right lower quadrant with tenderness to the periumbilical, suprapubic and left lower quadrants as well.  There is some guarding.  Abdomen is soft and nondistended.  His vital signs are  stable.  Labs without leukocytosis, lipase within normal limits.  CMP with very mild hypokalemia.  UA is negative for infection.  Given symptoms exam findings, CT scan ordered to rule out appendicitis versus other acute intra-abdominal pathology.  Pain treated.  8:08 PM CT is negative for acute pathology.  Patient reevaluated with improvement in symptoms.  Abdomen remains tender though without guarding.  No evidence of acute intra-abdominal pathology.  Discussed symptomatic management and strict return precautions, such as worsening pain or fever.  Recommend reevaluation in the ED as needed. Well-appearing and safe for discharge at this time.  Patient discussed with Dr. Anitra Lauth.  Discussed results, findings, treatment and follow up. Patient advised of return precautions. Patient verbalized understanding and agreed with plan.  Final Clinical Impressions(s) / ED Diagnoses   Final diagnoses:  Lower abdominal pain    ED Discharge Orders    None       Tadarrius Burch, [place] N, PA-C 09/02/18 2008    Gwyneth Sprout, MD 09/04/18 1644

## 2018-10-11 ENCOUNTER — Ambulatory Visit (HOSPITAL_COMMUNITY): Payer: 59 | Admitting: Nurse Practitioner

## 2018-10-22 ENCOUNTER — Ambulatory Visit (HOSPITAL_COMMUNITY)
Admission: RE | Admit: 2018-10-22 | Discharge: 2018-10-22 | Disposition: A | Discharge status: Home / Self Care | Payer: 59 | Source: Ambulatory Visit | Attending: Nurse Practitioner | Admitting: Nurse Practitioner

## 2018-10-22 VITALS — BP 114/64 | HR 58 | Ht 70.0 in | Wt 138.0 lb

## 2018-10-22 DIAGNOSIS — I4819 Other persistent atrial fibrillation: Secondary | ICD-10-CM | POA: Diagnosis not present

## 2018-10-22 DIAGNOSIS — F419 Anxiety disorder, unspecified: Secondary | ICD-10-CM | POA: Diagnosis not present

## 2018-10-22 NOTE — Progress Notes (Signed)
Primary Care Physician: Patient, No Pcp Per Referring Physician: Dr.  Ebony Cargo is a 27 y.o. male with a h/o SVT, followed by Dr. Elease Hashimoto, that was seen in an urgent care clinic 05/2017, for palpitations, found to be in rate controlled afib. He denied any use of caffeine, alcohol, drugs, no sleep apnea.   He felt he was out of rhythm for a week before he went to urgent care. He had EKG with him that very clearly documents afib. He was in rate controlled afib, when first seen in afib clinic 05/29/2017, HR in the 90's. 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 Melburn Popper started xarelto 20 mg a day on 05/29/17 and restarted metoprolol succinate 25 mg 1/2 tab a day.  On last visit, we tried to increase toprol to 25 mg 1/2 bid, to help restore SR,but he did not tolerate due to fatigue.  Unfortunately,f/u in afib clinic 06/12/17, he had not converted to SR with addtion of BB.   Successful  cardioversion 8/30. Marland KitchenStill no know triggers. Repeat echo showed rheumatic appearing valve, stable compared to last echo. He denies having rheumatic fever.  F/u in afib clinic 02/20/18. He went back into afib yesterday around 5 pm, while playing kick ball with his youth group.He has taken 12.5 mg metoprolol this am. He is rate controlled. He came off anticoagulation/BB last year after 4 weeks following cardioversion. Drank a couple of beers Friday Pm but still no significant amounts of alcohol. He restarted xarelto 20 mg daily within 24 hours of returning to afib.  F/u in afib clinic, 5/6. He persistens in afib. He is taking 12.5 mg bid metoprolol succinate and is rate controlled but makes him feel fatigued. We will plan for cardioversion and he will reduce to one half tab daily on cardioversion day.  F/u in afib clinic 12/30. He is now on flecainide 100 mg bid and is staying in SR. He feels well. No issues to report. No afib noted.   Today, he denies symptoms of palpitations, chest pain,  shortness of breath, orthopnea, PND, lower extremity edema, dizziness, presyncope, syncope, or neurologic sequela. + for fatigue.The patient is tolerating medications without difficulties and is otherwise without complaint today.   Past Medical History:  Diagnosis Date  . Anxiety   . Persistent atrial fibrillation    Past Surgical History:  Procedure Laterality Date  . CARDIOVERSION N/A 06/22/2017   Procedure: CARDIOVERSION;  Surgeon: Elease Hashimoto Deloris Ping, MD;  Location[facility] ENDOSCOPY;  Service: Cardiovascular;  Laterality: N/A;  . CARDIOVERSION N/A 03/02/2018   Procedure: CARDIOVERSION;  Surgeon: Lars Masson, MD;  Location: [facility] ENDOSCOPY;  Service: Cardiovascular;  Laterality: N/A;    Current Outpatient Medications  Medication Sig Dispense Refill  . flecainide (TAMBOCOR) 100 MG tablet TAKE 1 TABLET BY MOUTH TWICE A DAY 180 tablet 3  . metoprolol succinate (TOPROL-XL) 25 MG 24 hr tablet Take 0.5 tablets (12.5 mg total) by mouth at bedtime. 45 tablet 3  . Multiple Vitamin (MULTIVITAMIN WITH MINERALS) TABS tablet Take 1 tablet by mouth daily.      No current facility-administered medications for this encounter.     No Known Allergies  Social History   Socioeconomic History  . Marital status: Single    Spouse name: Not on file  . Number of children: Not on file  . Years of education: Not on file  . Highest education level: Not on file  Occupational History  .  Not on file  Social Needs  . Financial resource strain: Not on file  . Food insecurity:    Worry: Not on file    Inability: Not on file  . Transportation needs:    Medical: Not on file    Non-medical: Not on file  Tobacco Use  . Smoking status: Never Smoker  . Smokeless tobacco: Never Used  Substance and Sexual Activity  . Alcohol use: Not Currently  . Drug use: No  . Sexual activity: Not on file  Lifestyle  . Physical activity:    Days per week: Not on file    Minutes per session: Not on file  . Stress: Not on  file  Relationships  . Social connections:    Talks on phone: Not on file    Gets together: Not on file    Attends religious service: Not on file    Active member of club or organization: Not on file    Attends meetings of clubs or organizations: Not on file    Relationship status: Not on file  . Intimate partner violence:    Fear of current or ex partner: Not on file    Emotionally abused: Not on file    Physically abused: Not on file    Forced sexual activity: Not on file  Other Topics Concern  . Not on file  Social History Narrative  . Not on file    Family History  Problem Relation Age of Onset  . Atrial fibrillation Paternal Grandmother     ROS- All systems are reviewed and negative except as per the HPI above  Physical Exam: Vitals:   10/22/18 1430  BP: 114/64  Pulse: (!) 58  Weight: 62.6 kg  Height: 5\' 10"  (1.778 m)   Wt Readings from Last 3 Encounters:  10/22/18 62.6 kg  09/02/18 63.5 kg  06/18/18 63.9 kg    Labs: Lab Results  Component Value Date   NA 138 09/02/2018   K 3.4 (L) 09/02/2018   CL 103 09/02/2018   CO2 23 09/02/2018   GLUCOSE 163 (H) 09/02/2018   BUN 26 (H) 09/02/2018   CREATININE 0.82 09/02/2018   CALCIUM 9.9 09/02/2018   MG 2.2 06/12/2017   No results found for: INR No results found for: CHOL, HDL, LDLCALC, TRIG   GEN- The patient is well appearing, alert and oriented x 3 today.   Head- normocephalic, atraumatic Eyes-  Sclera clear, conjunctiva pink Ears- hearing intact Oropharynx- clear Neck- supple, no JVP Lymph- no cervical lymphadenopathy Lungs- Clear to ausculation bilaterally, normal work of breathing Heart- irregular rate and rhythm, no murmurs, rubs or gallops, PMI not laterally displaced GI- soft, NT, ND, + BS Extremities- no clubbing, cyanosis, or edema MS- no significant deformity or atrophy Skin- no rash or lesion Psych- euthymic mood, full affect Neuro- strength and sensation are intact  EKG-Sinus brady at  58 bpm, qrs int 106 ms, qtc 400 ms Ekg from urgent care also reviewed Echo Study Conclusions  - Left ventricle: The cavity size was normal. Systolic function was   normal. The estimated ejection fraction was in the range of 55%   to 60%. Wall motion was normal; there were no regional wall   motion abnormalities. - Aortic valve: Transvalvular velocity was within the normal range.   There was no stenosis. There was no regurgitation. - Mitral valve: Thickening. , consistent with rheumatic disease.   Transvalvular velocity was within the normal range. There was no   evidence for  stenosis. There was mild regurgitation. - Right ventricle: The cavity size was normal. Wall thickness was   normal. Systolic function was normal. - Tricuspid valve: There was trivial regurgitation. - Pulmonary arteries: Systolic pressure was within the normal   range.    Assessment and Plan: 1.Persistent afib, rate controlled Staying in SR  Continue metoprolol succinate 25 mg at 12.5 mg daily Now off xarelto for chadsvasc score of 0 Continue flecainide at 100 mg bid     F/u in 6 months  Lupita LeashDonna C. Matthew Folksarroll, ANP-C [facility] [address] [place], [place] [address] [phone]

## 2019-01-02 ENCOUNTER — Other Ambulatory Visit (HOSPITAL_COMMUNITY): Payer: Self-pay | Admitting: *Deleted

## 2019-01-02 MED ORDER — FLECAINIDE ACETATE 100 MG PO TABS: 100.0000 mg | ORAL_TABLET | Freq: Two times a day (BID) | ORAL | 3 refills | Status: DC

## 2019-04-22 ENCOUNTER — Ambulatory Visit (HOSPITAL_COMMUNITY): Payer: 59 | Admitting: Nurse Practitioner

## 2019-06-13 ENCOUNTER — Other Ambulatory Visit (HOSPITAL_COMMUNITY): Payer: Self-pay | Admitting: Internal Medicine

## 2019-08-16 IMAGING — US US SCROTUM W/ DOPPLER COMPLETE
1 series · 14 of 25 positions shown · non-contrast
Comparison: None.

CLINICAL DATA: Left testicular swelling for 6 months.

EXAM:
SCROTAL ULTRASOUND
DOPPLER ULTRASOUND OF THE TESTICLES
TECHNIQUE: Complete ultrasound examination of the testicles, epididymis, and
other scrotal structures was performed. Color and spectral Doppler
ultrasound were also utilized to evaluate blood flow to the
testicles.

[Series 1: us scrotum w/ doppler complete · 0.06mm/px · 14 of 56 slices shown]
[im 1/56]
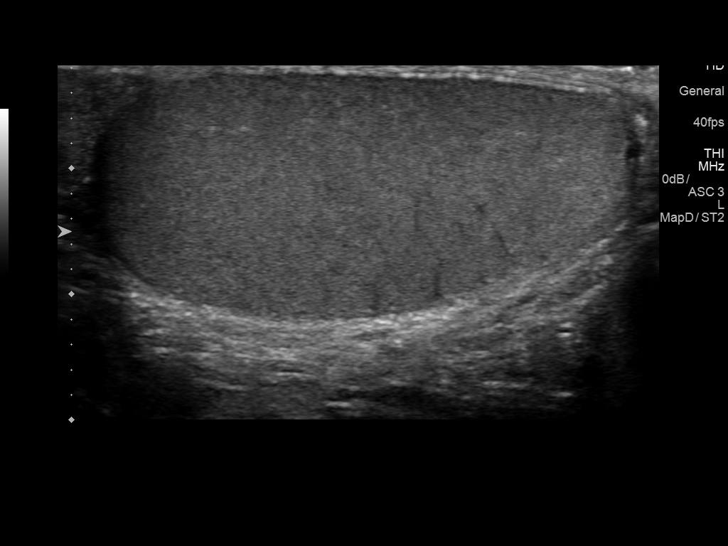
[im 5/56]
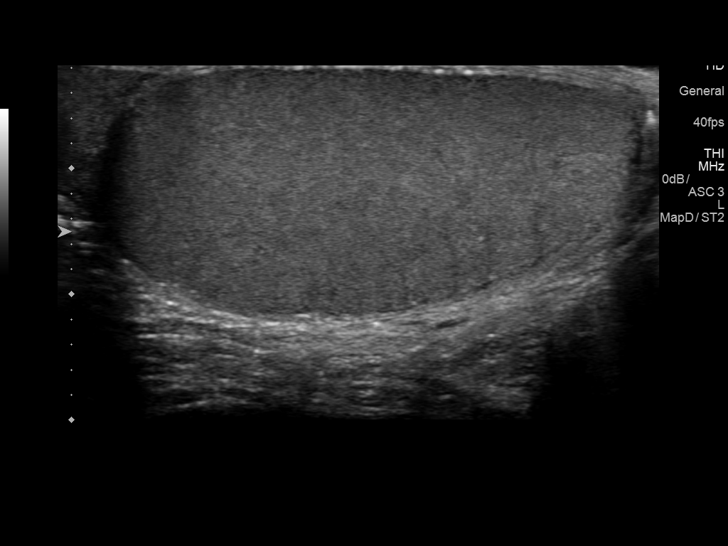
[im 10/56]
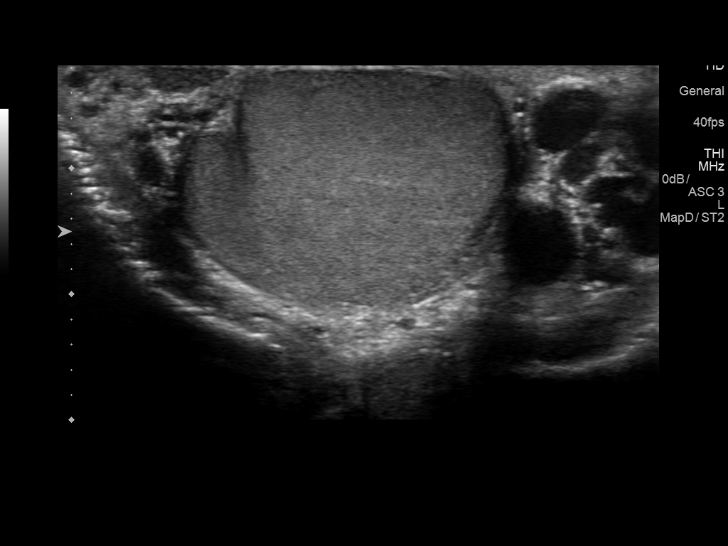
[im 14/56]
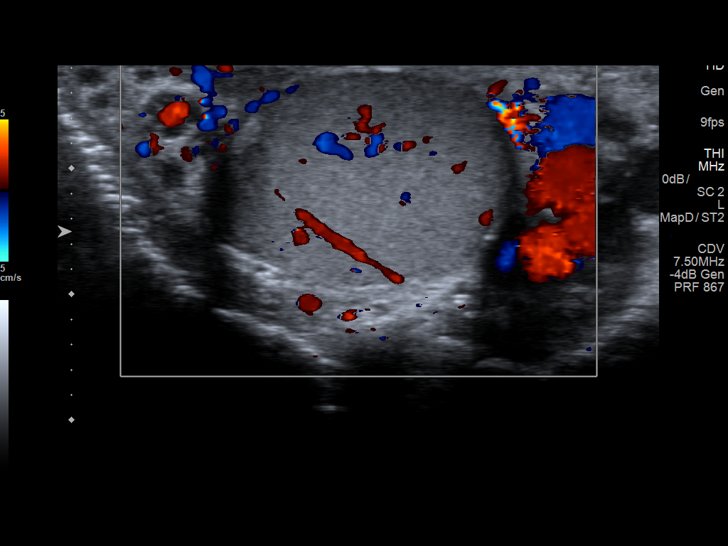
[im 19/56]
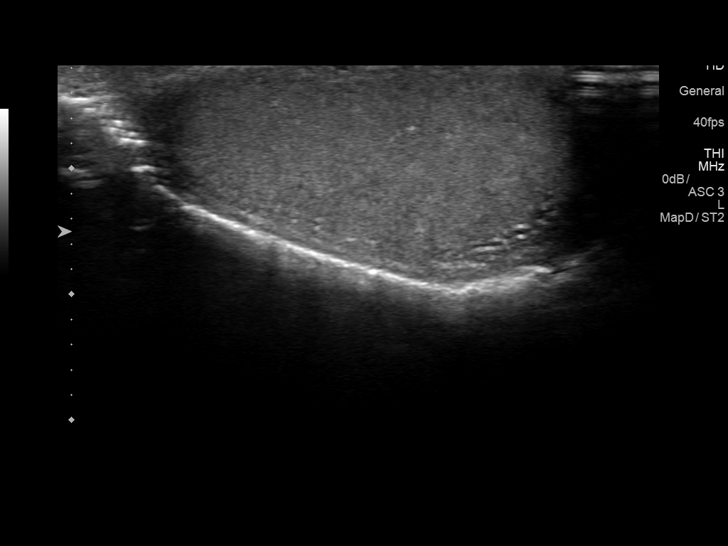
[im 21/56]
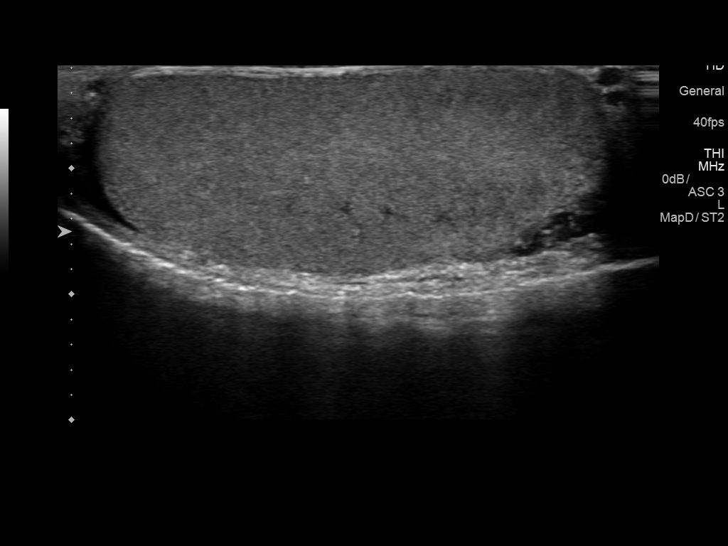
[im 26/56]
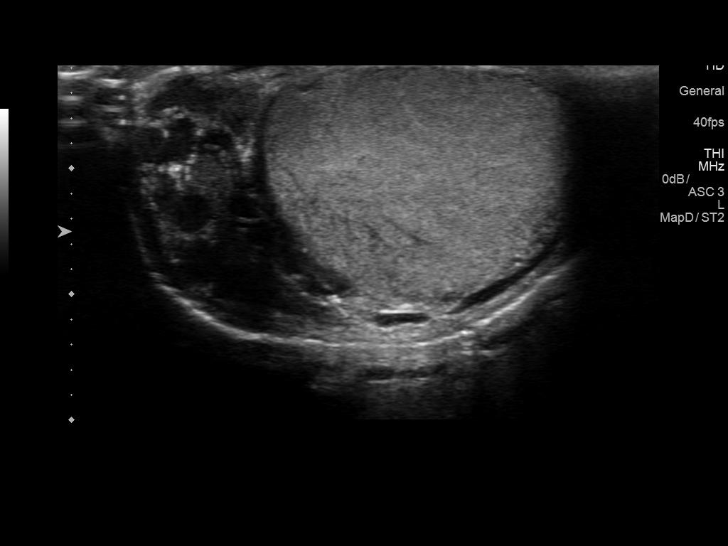
[im 30/56]
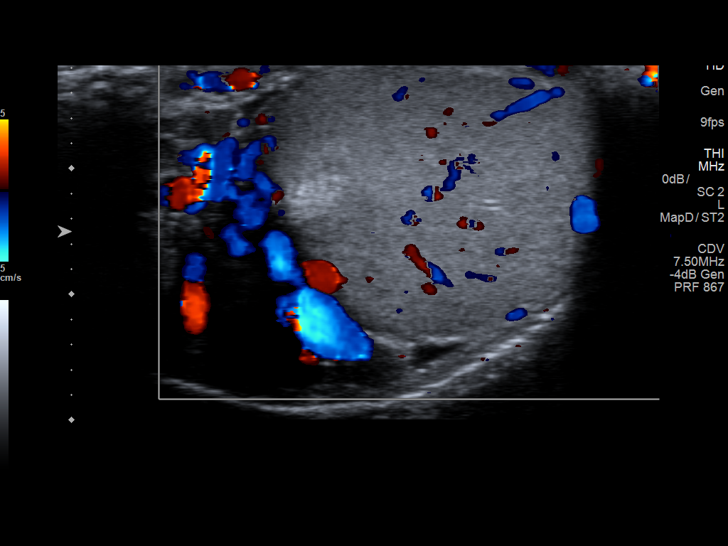
[im 35/56]
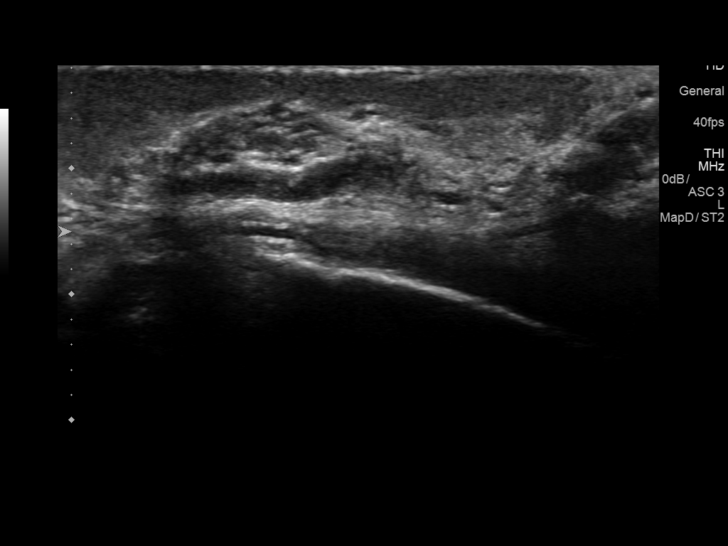
[im 37/56]
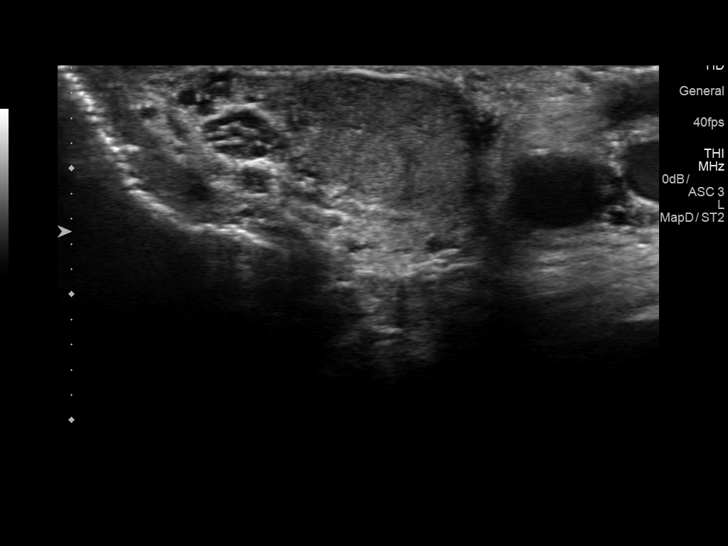
[im 42/56]
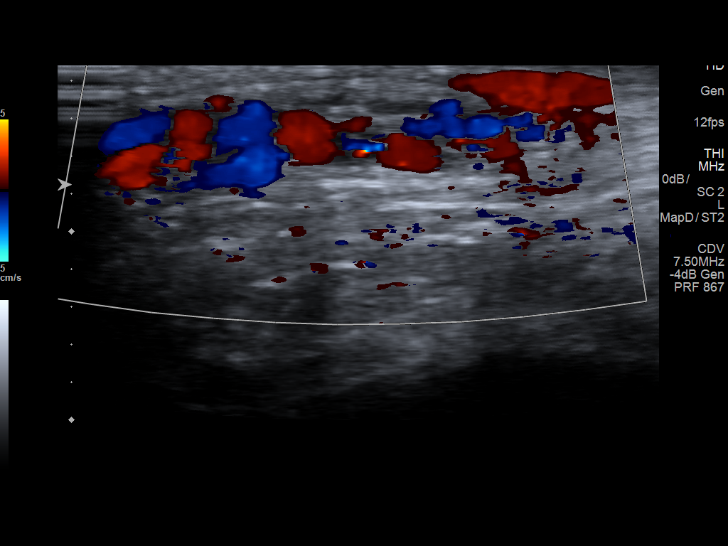
[im 46/56]
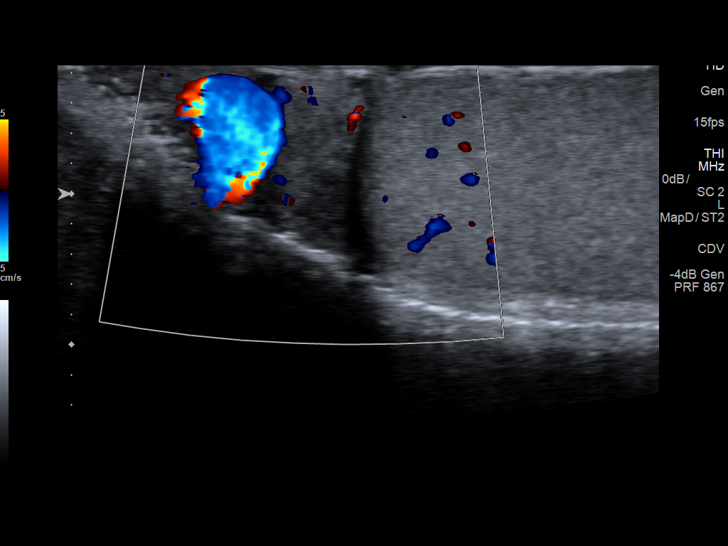
[im 51/56]
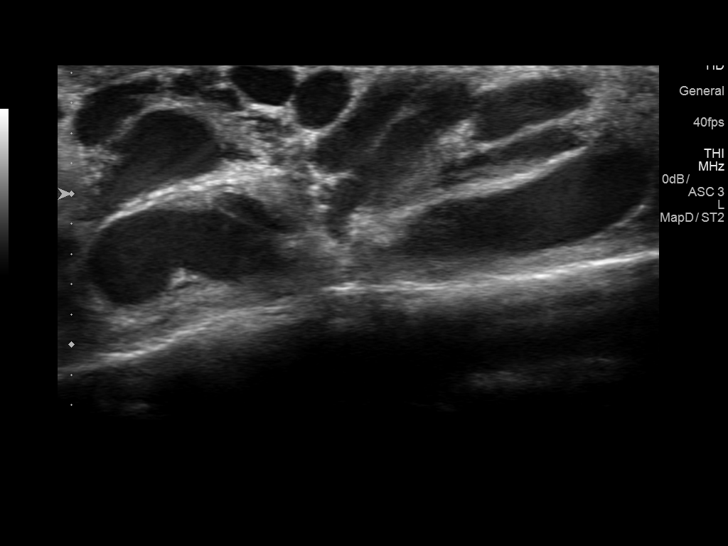
[im 56/56]
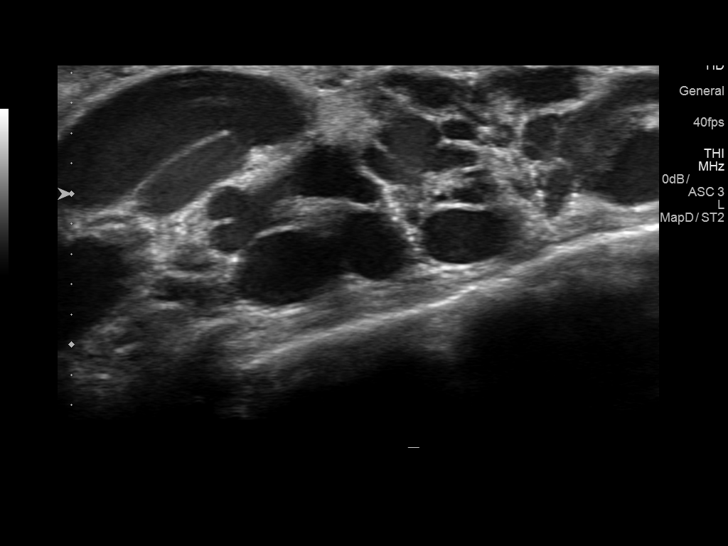

[14 of 25 positions shown; findings below may reference images not displayed]

FINDINGS: Right testicle

Measurements: 4.3 x 2.0 x 2.6 cm. No mass or microlithiasis
visualized.

Left testicle

Measurements: 4.1 x 1.7 2.5 cm. No mass or microlithiasis
visualized.

Right epididymis:  Normal in size and appearance.

Left epididymis:  Normal in size and appearance.

Hydrocele:  None visualized.

Varicocele: Large left varicocele is seen.  No right varicocele.

Pulsed Doppler interrogation of both testes demonstrates normal low
resistance arterial and venous waveforms bilaterally.
IMPRESSION: Large left varicocele.  The exam is otherwise is negative.

## 2019-08-25 IMAGING — US US ABDOMEN LIMITED
1 series · 14 of 25 positions shown · non-contrast
Comparison: None.

CLINICAL DATA: Diffuse abdominal pain

EXAM:
ULTRASOUND ABDOMEN LIMITED RIGHT UPPER QUADRANT

[Series 1: us abdomen limited · 0.17mm/px · 14 of 42 slices shown]
[im 1/42]
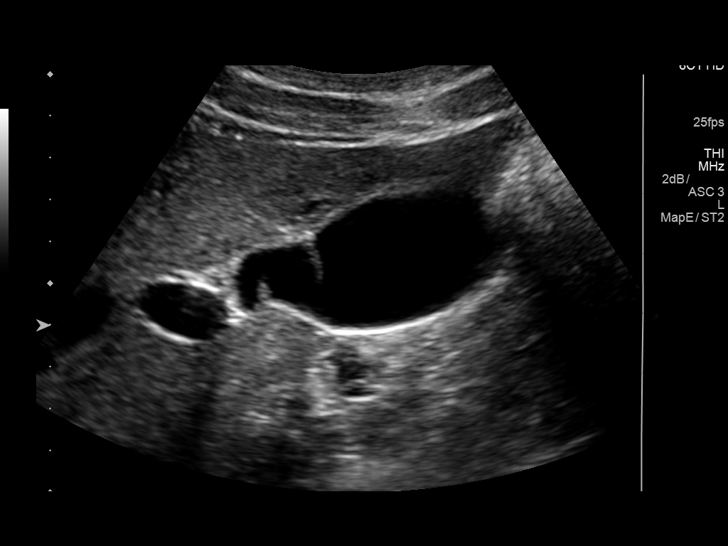
[im 4/42]
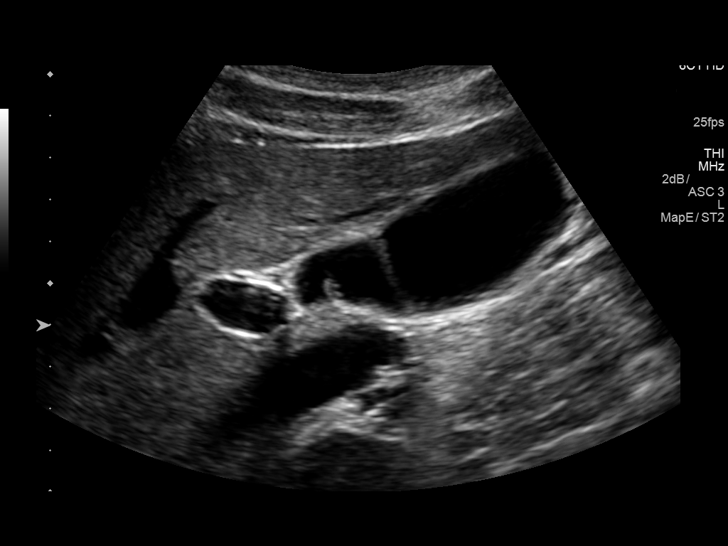
[im 7/42]
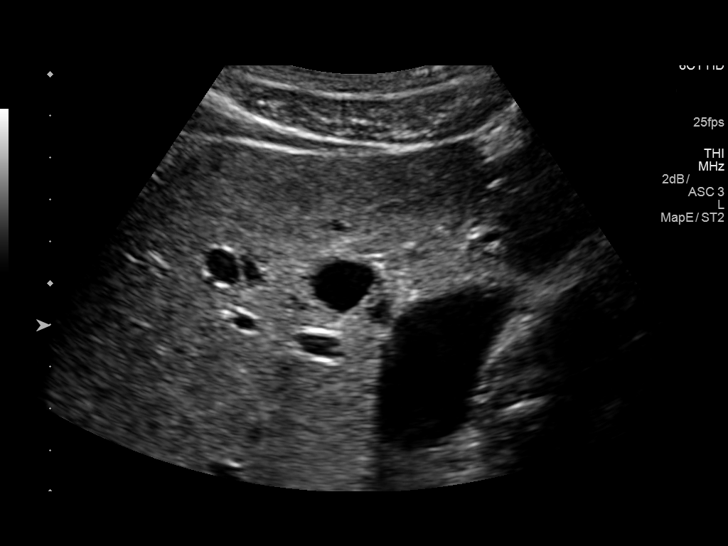
[im 11/42]
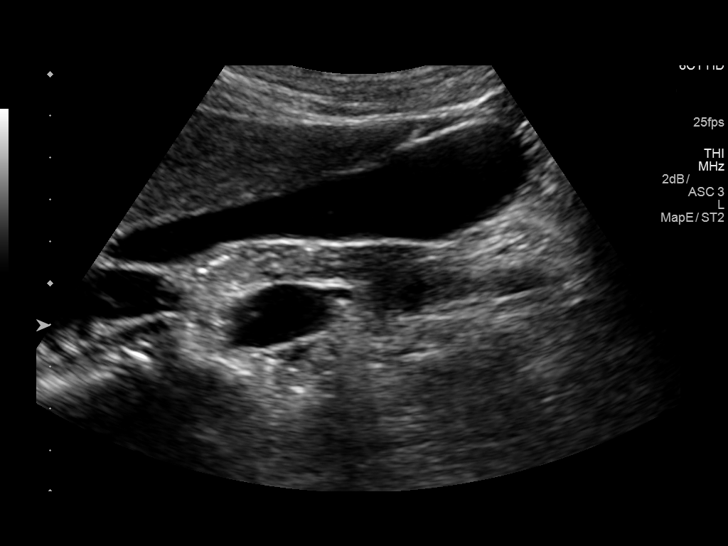
[im 14/42]
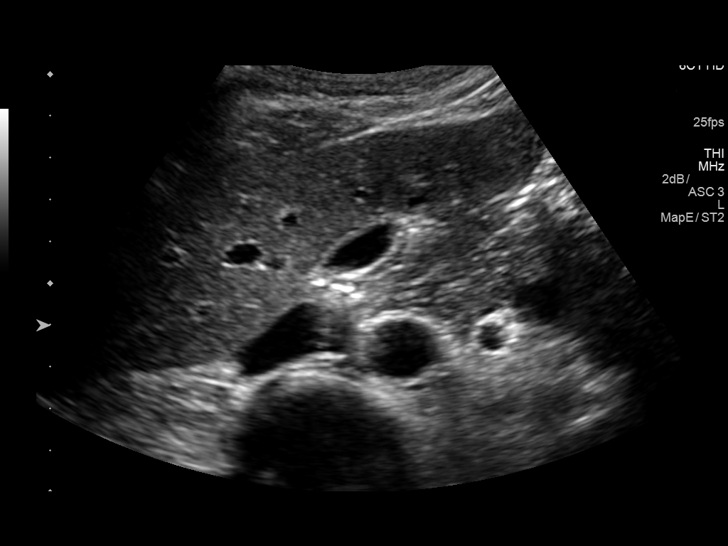
[im 16/42]
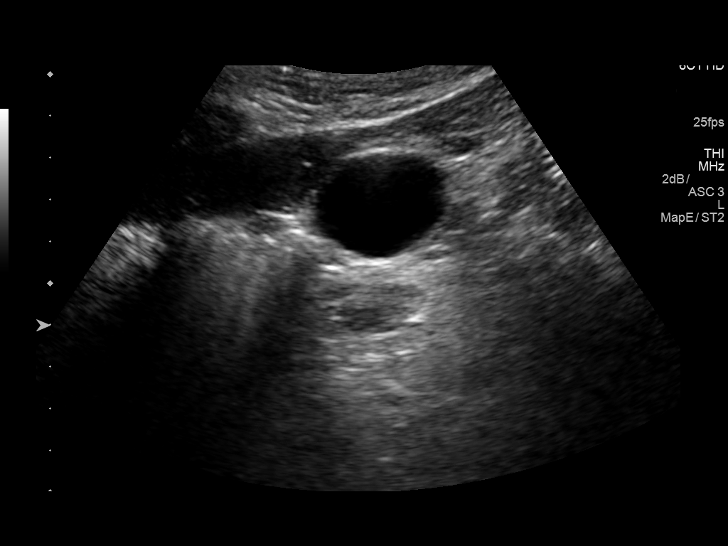
[im 19/42]
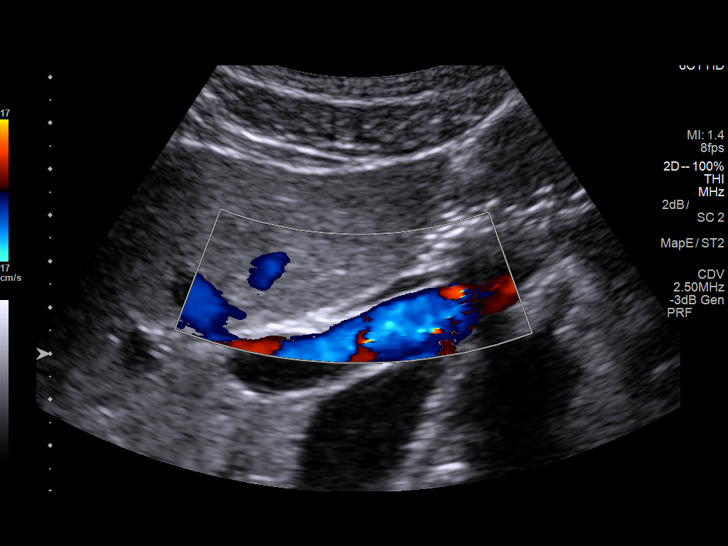
[im 23/42]
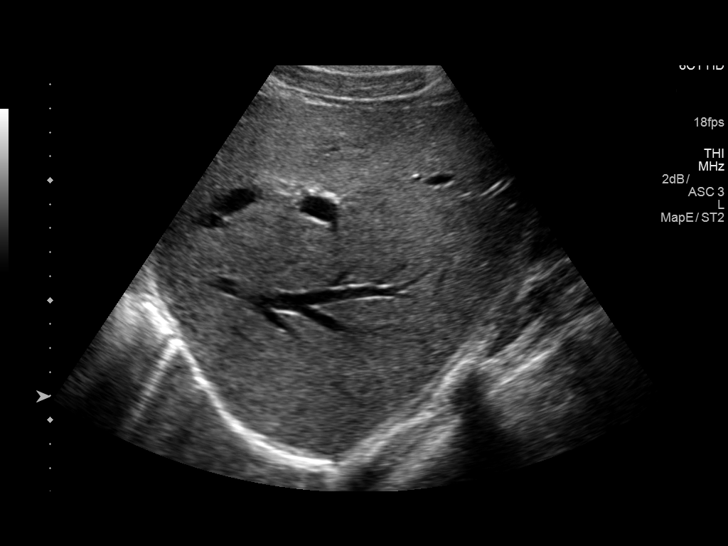
[im 26/42]
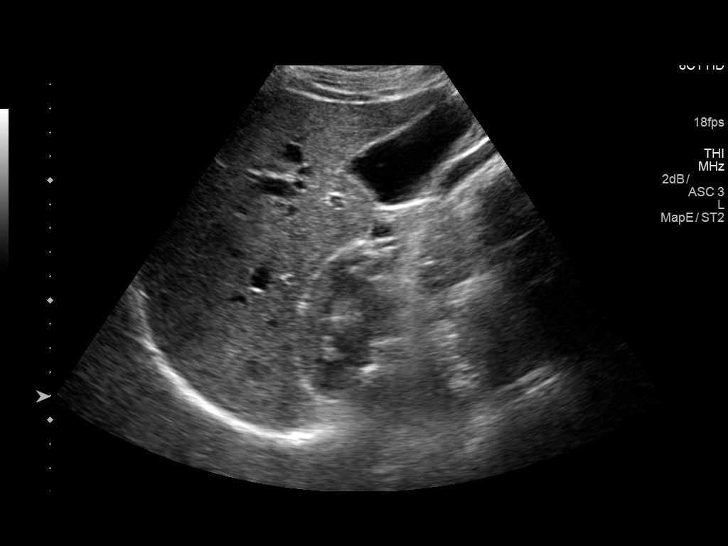
[im 28/42]
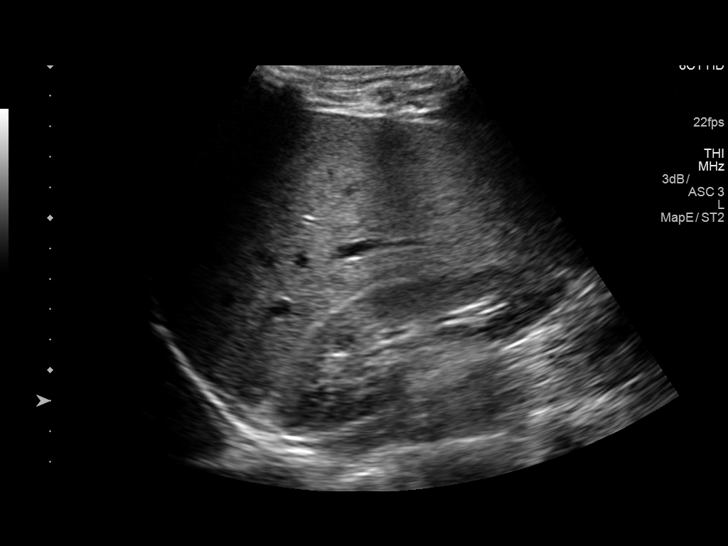
[im 31/42]
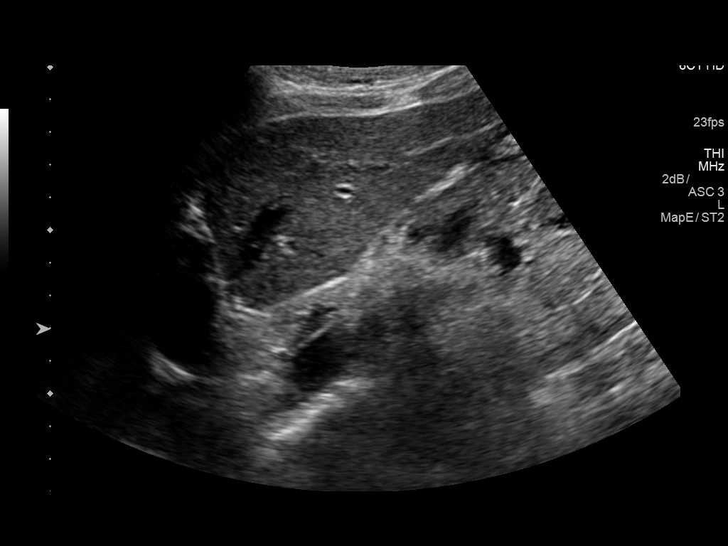
[im 35/42]
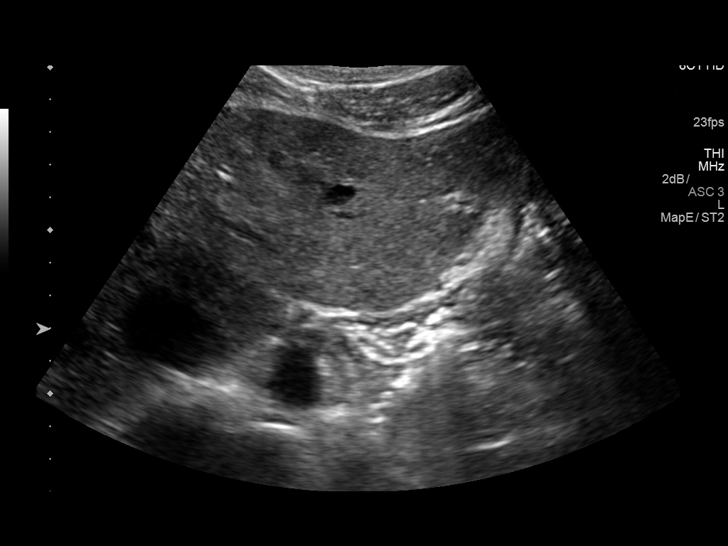
[im 38/42]
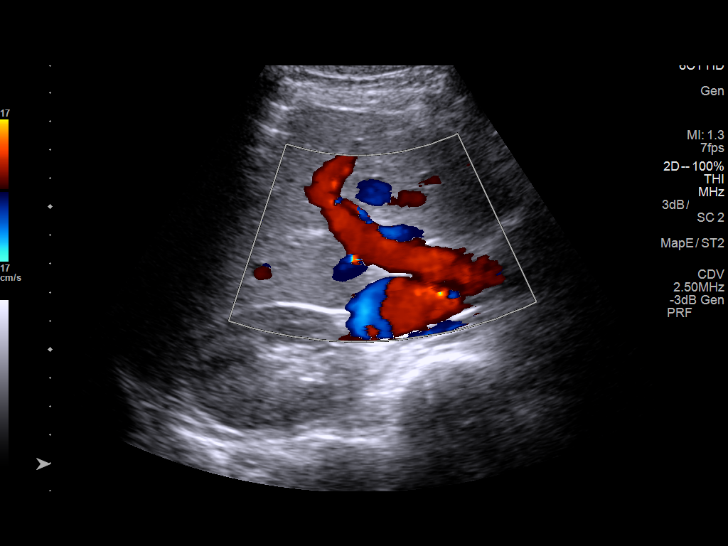
[im 42/42]
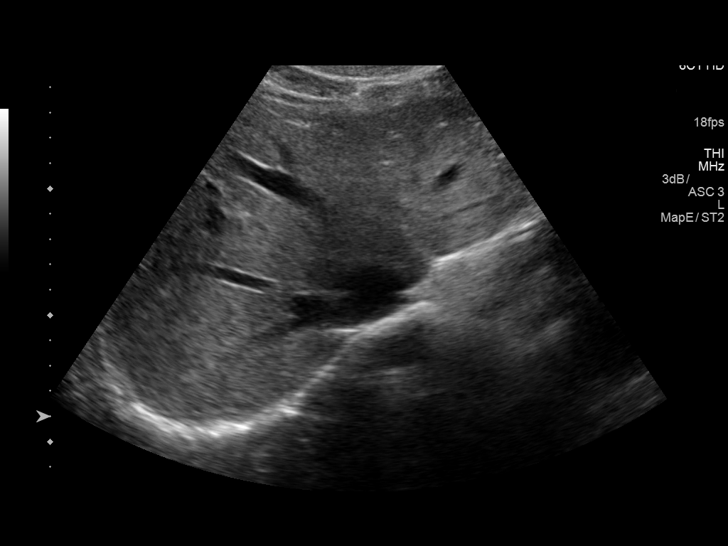

[14 of 25 positions shown; findings below may reference images not displayed]

FINDINGS: Gallbladder:

The gallbladder is visualized and no gallstones are seen. There is
no pain over the gallbladder with compression.

Common bile duct:

Diameter: The common bile duct is normal measuring 3.2 mm in
diameter.

Liver:

The echogenicity of the liver parenchyma is within normal limits. No
focal hepatic abnormality is seen. Portal vein is patent on color
Doppler imaging with normal direction of blood flow towards the
liver.
IMPRESSION: Negative limited ultrasound of the right upper quadrant. No
gallstones.

## 2019-08-28 ENCOUNTER — Other Ambulatory Visit (HOSPITAL_COMMUNITY): Payer: Self-pay | Admitting: Nurse Practitioner

## 2019-09-30 ENCOUNTER — Other Ambulatory Visit (HOSPITAL_COMMUNITY): Payer: Self-pay | Admitting: *Deleted

## 2019-09-30 MED ORDER — FLECAINIDE ACETATE 100 MG PO TABS: 100.0000 mg | ORAL_TABLET | Freq: Two times a day (BID) | ORAL | 0 refills | Status: DC

## 2019-10-07 ENCOUNTER — Ambulatory Visit (HOSPITAL_COMMUNITY): Payer: Self-pay | Admitting: Nurse Practitioner

## 2019-10-28 ENCOUNTER — Other Ambulatory Visit (HOSPITAL_COMMUNITY): Payer: Self-pay

## 2019-10-28 MED ORDER — FLECAINIDE ACETATE 100 MG PO TABS: 100.0000 mg | ORAL_TABLET | Freq: Two times a day (BID) | ORAL | 0 refills | Status: DC

## 2019-11-01 ENCOUNTER — Ambulatory Visit (HOSPITAL_COMMUNITY): Payer: Self-pay | Admitting: Nurse Practitioner

## 2019-11-26 ENCOUNTER — Ambulatory Visit (HOSPITAL_COMMUNITY): Payer: Self-pay | Admitting: Nurse Practitioner

## 2019-11-29 ENCOUNTER — Ambulatory Visit (HOSPITAL_COMMUNITY)
Admission: RE | Admit: 2019-11-29 | Discharge: 2019-11-29 | Disposition: A | Discharge status: Home / Self Care | Payer: Self-pay | Source: Ambulatory Visit | Attending: Nurse Practitioner | Admitting: Nurse Practitioner

## 2019-11-29 ENCOUNTER — Encounter (HOSPITAL_COMMUNITY): Payer: Self-pay | Admitting: Nurse Practitioner

## 2019-11-29 ENCOUNTER — Other Ambulatory Visit: Payer: Self-pay

## 2019-11-29 VITALS — BP 112/78 | HR 52 | Ht 70.0 in | Wt 141.0 lb

## 2019-11-29 DIAGNOSIS — Z79899 Other long term (current) drug therapy: Secondary | ICD-10-CM | POA: Insufficient documentation

## 2019-11-29 DIAGNOSIS — F419 Anxiety disorder, unspecified: Secondary | ICD-10-CM | POA: Insufficient documentation

## 2019-11-29 DIAGNOSIS — I480 Paroxysmal atrial fibrillation: Secondary | ICD-10-CM

## 2019-11-29 DIAGNOSIS — I4819 Other persistent atrial fibrillation: Secondary | ICD-10-CM | POA: Insufficient documentation

## 2019-11-29 DIAGNOSIS — D6869 Other thrombophilia: Secondary | ICD-10-CM

## 2019-11-29 MED ORDER — FLECAINIDE ACETATE 100 MG PO TABS: 100.0000 mg | ORAL_TABLET | Freq: Two times a day (BID) | ORAL | 1 refills | Status: DC

## 2019-11-29 MED ORDER — METOPROLOL SUCCINATE ER 25 MG PO TB24: 12.5000 mg | ORAL_TABLET | Freq: Every day | ORAL | 1 refills | Status: DC

## 2019-11-29 NOTE — Progress Notes (Signed)
Primary Care Physician: Patient, No Pcp Per Referring Physician: Dr.  Ebony Cargo is a 29 y.o. male with a h/o SVT, followed by Dr. Elease Hashimoto, that was seen in an urgent care clinic 05/2017, for palpitations, found to be in rate controlled afib. He denied any use of caffeine, alcohol, drugs, no sleep apnea.   He felt he was out of rhythm for a week before he went to urgent care. He had EKG with him that very clearly documents afib. He was in rate controlled afib, when first seen in afib clinic 05/29/2017, HR in the 90's. 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 Melburn Popper started xarelto 20 mg a day on 05/29/17 and restarted metoprolol succinate 25 mg 1/2 tab a day.  On last visit, we tried to increase toprol to 25 mg 1/2 bid, to help restore SR,but he did not tolerate due to fatigue.  Unfortunately,f/u in afib clinic 06/12/17, he had not converted to SR with addtion of BB.   Successful  cardioversion 8/30. Marland KitchenStill no know triggers. Repeat echo showed rheumatic appearing valve, stable compared to last echo. He denies having rheumatic fever.  F/u in afib clinic 02/20/18. He went back into afib yesterday around 5 pm, while playing kick ball with his youth group.He has taken 12.5 mg metoprolol this am. He is rate controlled. He came off anticoagulation/BB last year after 4 weeks following cardioversion. Drank a couple of beers Friday Pm but still no significant amounts of alcohol. He restarted xarelto 20 mg daily within 24 hours of returning to afib.  F/u in afib clinic, 5/6. He persistens in afib. He is taking 12.5 mg bid metoprolol succinate and is rate controlled but makes him feel fatigued. We will plan for cardioversion and he will reduce to one half tab daily on cardioversion day.  F/u in afib clinic 12/30. He is now on flecainide 100 mg bid and is staying in SR. He feels well. No issues to report. No afib noted.   F/u in afib clinic, 11/29/19. Pt has moved to [place]  but he still chooses to come here intermittently for f/u of his afib. He reports that his flecainide is still doing a good job controlling his afib. He notices very few palpitations and usually just lasts  a few minutes.   Today, he denies symptoms of palpitations, chest pain, shortness of breath, orthopnea, PND, lower extremity edema, dizziness, presyncope, syncope, or neurologic sequela. + for fatigue.The patient is tolerating medications without difficulties and is otherwise without complaint today.   Past Medical History:  Diagnosis Date  . Anxiety   . Persistent atrial fibrillation    Past Surgical History:  Procedure Laterality Date  . CARDIOVERSION N/A 06/22/2017   Procedure: CARDIOVERSION;  Surgeon: Elease Hashimoto Deloris Ping, MD;  Location: [facility] ENDOSCOPY;  Service: Cardiovascular;  Laterality: N/A;  . CARDIOVERSION N/A 03/02/2018   Procedure: CARDIOVERSION;  Surgeon: Lars Masson, MD;  Location: [facility] ENDOSCOPY;  Service: Cardiovascular;  Laterality: N/A;    Current Outpatient Medications  Medication Sig Dispense Refill  . flecainide (TAMBOCOR) 100 MG tablet Take 1 tablet (100 mg total) by mouth 2 (two) times daily. 180 tablet 1  . metoprolol succinate (TOPROL-XL) 25 MG 24 hr tablet Take 0.5 tablets (12.5 mg total) by mouth at bedtime. 45 tablet 1  . Multiple Vitamin (MULTIVITAMIN WITH MINERALS) TABS tablet Take 1 tablet by mouth daily.      No current facility-administered medications for this  encounter.    No Known Allergies  Social History   Socioeconomic History  . Marital status: Single    Spouse name: Not on file  . Number of children: Not on file  . Years of education: Not on file  . Highest education level: Not on file  Occupational History  . Not on file  Tobacco Use  . Smoking status: Never Smoker  . Smokeless tobacco: Never Used  Substance and Sexual Activity  . Alcohol use: Not Currently  . Drug use: No  . Sexual activity: Not on file  Other Topics Concern  .  Not on file  Social History Narrative  . Not on file   Social Determinants of Health   Financial Resource Strain:   . Difficulty of Paying Living Expenses: Not on file  Food Insecurity:   . Worried About Charity fundraiser in the Last Year: Not on file  . Ran Out of Food in the Last Year: Not on file  Transportation Needs:   . Lack of Transportation (Medical): Not on file  . Lack of Transportation (Non-Medical): Not on file  Physical Activity:   . Days of Exercise per Week: Not on file  . Minutes of Exercise per Session: Not on file  Stress:   . Feeling of Stress : Not on file  Social Connections:   . Frequency of Communication with Friends and Family: Not on file  . Frequency of Social Gatherings with Friends and Family: Not on file  . Attends Religious Services: Not on file  . Active Member of Clubs or Organizations: Not on file  . Attends Archivist Meetings: Not on file  . Marital Status: Not on file  Intimate Partner Violence:   . Fear of Current or Ex-Partner: Not on file  . Emotionally Abused: Not on file  . Physically Abused: Not on file  . Sexually Abused: Not on file    Family History  Problem Relation Age of Onset  . Atrial fibrillation Paternal Grandmother     ROS- All systems are reviewed and negative except as per the HPI above  Physical Exam: Vitals:   11/29/19 0951  BP: 112/78  Pulse: (!) 52  Weight: 64 kg  Height: 5\' 10"  (1.778 m)   Wt Readings from Last 3 Encounters:  11/29/19 64 kg  10/22/18 62.6 kg  09/02/18 63.5 kg    Labs: Lab Results  Component Value Date   NA 138 09/02/2018   K 3.4 (L) 09/02/2018   CL 103 09/02/2018   CO2 23 09/02/2018   GLUCOSE 163 (H) 09/02/2018   BUN 26 (H) 09/02/2018   CREATININE 0.82 09/02/2018   CALCIUM 9.9 09/02/2018   MG 2.2 06/12/2017   No results found for: INR No results found for: CHOL, HDL, LDLCALC, TRIG   GEN- The patient is well appearing, alert and oriented x 3 today.   Head-  normocephalic, atraumatic Eyes-  Sclera clear, conjunctiva pink Ears- hearing intact Oropharynx- clear Neck- supple, no JVP Lymph- no cervical lymphadenopathy Lungs- Clear to ausculation bilaterally, normal work of breathing Heart- regular rate and rhythm, no murmurs, rubs or gallops, PMI not laterally displaced GI- soft, NT, ND, + BS Extremities- no clubbing, cyanosis, or edema MS- no significant deformity or atrophy Skin- no rash or lesion Psych- euthymic mood, full affect Neuro- strength and sensation are intact  EKG-Sinus brady at 52 bpm, pr int 180 ms,  qrs int 102 ms, qtc 403 ms Ekg from urgent care also  reviewed Echo Study Conclusions  - Left ventricle: The cavity size was normal. Systolic function was   normal. The estimated ejection fraction was in the range of 55%   to 60%. Wall motion was normal; there were no regional wall   motion abnormalities. - Aortic valve: Transvalvular velocity was within the normal range.   There was no stenosis. There was no regurgitation. - Mitral valve: Thickening. , consistent with rheumatic disease.   Transvalvular velocity was within the normal range. There was no   evidence for stenosis. There was mild regurgitation. - Right ventricle: The cavity size was normal. Wall thickness was   normal. Systolic function was normal. - Tricuspid valve: There was trivial regurgitation. - Pulmonary arteries: Systolic pressure was within the normal   range.    Assessment and Plan: 1.Persistent afib, rate controlled Staying in SR  Tolerating flecainide well  Continue metoprolol succinate 25 mg at 12.5 mg at hs  Off xarelto for chadsvasc score of 0 Continue flecainide at 100 mg bid  If afib returns  consider ablation    F/u in 6 months  Lupita Leash C. [facility] [address] [place], [place] [address] [phone]

## 2019-12-13 ENCOUNTER — Other Ambulatory Visit: Payer: Self-pay | Admitting: Cardiovascular Disease

## 2020-05-04 ENCOUNTER — Encounter (HOSPITAL_COMMUNITY): Payer: Self-pay

## 2020-05-29 ENCOUNTER — Ambulatory Visit (HOSPITAL_COMMUNITY): Payer: Self-pay | Admitting: Nurse Practitioner

## 2020-06-01 ENCOUNTER — Telehealth (HOSPITAL_COMMUNITY): Payer: Self-pay

## 2020-06-01 MED ORDER — FLECAINIDE ACETATE 100 MG PO TABS: 100.0000 mg | ORAL_TABLET | Freq: Two times a day (BID) | ORAL | 0 refills | Status: DC

## 2020-06-01 MED ORDER — METOPROLOL SUCCINATE ER 25 MG PO TB24: 12.5000 mg | ORAL_TABLET | Freq: Every day | ORAL | 0 refills | Status: DC

## 2020-06-01 NOTE — Telephone Encounter (Signed)
Patient called in needing a refill on his Flecainide 100mg  and his Metoprolol. Per - PA he agreed to only fill quanity- one week total.  He is scheduled for a follow up with Jorja Loa on 8/12 @ 10:00am. Patient verbalized understanding.

## 2020-06-04 ENCOUNTER — Ambulatory Visit (HOSPITAL_COMMUNITY)
Admission: RE | Admit: 2020-06-04 | Discharge: 2020-06-04 | Disposition: A | Discharge status: Home / Self Care | Payer: Self-pay | Source: Ambulatory Visit | Attending: Nurse Practitioner | Admitting: Nurse Practitioner

## 2020-06-04 ENCOUNTER — Other Ambulatory Visit: Payer: Self-pay

## 2020-06-04 ENCOUNTER — Encounter (HOSPITAL_COMMUNITY): Payer: Self-pay | Admitting: Nurse Practitioner

## 2020-06-04 VITALS — BP 102/66 | HR 56 | Ht 70.0 in | Wt 144.8 lb

## 2020-06-04 DIAGNOSIS — I480 Paroxysmal atrial fibrillation: Secondary | ICD-10-CM

## 2020-06-04 DIAGNOSIS — I471 Supraventricular tachycardia: Secondary | ICD-10-CM | POA: Insufficient documentation

## 2020-06-04 DIAGNOSIS — Z8249 Family history of ischemic heart disease and other diseases of the circulatory system: Secondary | ICD-10-CM | POA: Insufficient documentation

## 2020-06-04 DIAGNOSIS — D6869 Other thrombophilia: Secondary | ICD-10-CM

## 2020-06-04 DIAGNOSIS — Z79899 Other long term (current) drug therapy: Secondary | ICD-10-CM | POA: Insufficient documentation

## 2020-06-04 DIAGNOSIS — F419 Anxiety disorder, unspecified: Secondary | ICD-10-CM | POA: Insufficient documentation

## 2020-06-04 DIAGNOSIS — Z7901 Long term (current) use of anticoagulants: Secondary | ICD-10-CM | POA: Insufficient documentation

## 2020-06-04 DIAGNOSIS — I4819 Other persistent atrial fibrillation: Secondary | ICD-10-CM | POA: Insufficient documentation

## 2020-06-04 MED ORDER — METOPROLOL SUCCINATE ER 25 MG PO TB24: 12.5000 mg | ORAL_TABLET | Freq: Every day | ORAL | 1 refills | Status: DC

## 2020-06-04 MED ORDER — FLECAINIDE ACETATE 100 MG PO TABS: 100.0000 mg | ORAL_TABLET | Freq: Two times a day (BID) | ORAL | 1 refills | Status: DC

## 2020-06-04 NOTE — Addendum Note (Signed)
Encounter addended by: Learta Codding, CMA on: 06/04/2020 11:10 AM  Actions taken: Order list changed

## 2020-06-04 NOTE — Progress Notes (Signed)
Primary Care Physician: Patient, No Pcp Per Referring Physician: Dr.  Ebony Cargo is a 29 y.o. male with a h/o SVT, followed by Dr. Elease Hashimoto, that was seen in an urgent care clinic 05/2017, for palpitations, found to be in rate controlled afib. He denied any use of caffeine, alcohol, drugs, no sleep apnea.   He felt he was out of rhythm for a week before he went to urgent care. He had EKG with him that very clearly documents afib. He was in rate controlled afib, when first seen in afib clinic 05/29/2017, HR in the 90's. 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 Melburn Popper started xarelto 20 mg a day on 05/29/17 and restarted metoprolol succinate 25 mg 1/2 tab a day.  On last visit, we tried to increase toprol to 25 mg 1/2 bid, to help restore SR,but he did not tolerate due to fatigue.  Unfortunately,f/u in afib clinic 06/12/17, he had not converted to SR with addtion of BB.   Successful  cardioversion 8/30. Marland KitchenStill no know triggers. Repeat echo showed rheumatic appearing valve, stable compared to last echo. He denies having rheumatic fever.  F/u in afib clinic 02/20/18. He went back into afib yesterday around 5 pm, while playing kick ball with his youth group.He has taken 12.5 mg metoprolol this am. He is rate controlled. He came off anticoagulation/BB last year after 4 weeks following cardioversion. Drank a couple of beers Friday Pm but still no significant amounts of alcohol. He restarted xarelto 20 mg daily within 24 hours of returning to afib.  F/u in afib clinic, 5/6. He persistens in afib. He is taking 12.5 mg bid metoprolol succinate and is rate controlled but makes him feel fatigued. We will plan for cardioversion and he will reduce to one half tab daily on cardioversion day.  F/u in afib clinic 12/30. He is now on flecainide 100 mg bid and is staying in SR. He feels well. No issues to report. No afib noted.   F/u in afib clinic, 11/29/19. Pt has moved to [place]  but he still chooses to come here intermittently for f/u of his afib. He reports that his flecainide is still doing a good job controlling his afib. He notices very few palpitations and usually just lasts a few minutes.   F/u afib clinic, 06/04/20. Pt reports that he continues  to have SR, no breakthrough afib. He does describe some palpitations a few weeks ago that sound like PC's. Resolved after a day. He describes some atypical type of chest discomfort sometimes associated with exercise, he has has mentioned before over the years. His ETT in 2019 showed excellent exercise tolerance, no arrhythmia's on flecainide. He continues to live in [place].  Today, he denies symptoms of palpitations, chest pain, shortness of breath, orthopnea, PND, lower extremity edema, dizziness, presyncope, syncope, or neurologic sequela. + for fatigue.The patient is tolerating medications without difficulties and is otherwise without complaint today.   Past Medical History:  Diagnosis Date  . Anxiety   . Persistent atrial fibrillation [facility])    Past Surgical History:  Procedure Laterality Date  . CARDIOVERSION N/A 06/22/2017   Procedure: CARDIOVERSION;  Surgeon: Elease Hashimoto Deloris Ping, MD;  Location: [facility] ENDOSCOPY;  Service: Cardiovascular;  Laterality: N/A;  . CARDIOVERSION N/A 03/02/2018   Procedure: CARDIOVERSION;  Surgeon: Lars Masson, MD;  Location: [facility] ENDOSCOPY;  Service: Cardiovascular;  Laterality: N/A;    Current Outpatient Medications  Medication Sig Dispense  Refill  . flecainide (TAMBOCOR) 100 MG tablet Take 1 tablet (100 mg total) by mouth 2 (two) times daily. 14 tablet 0  . metoprolol succinate (TOPROL-XL) 25 MG 24 hr tablet Take 0.5 tablets (12.5 mg total) by mouth at bedtime. 7 tablet 0  . Multiple Vitamin (MULTIVITAMIN WITH MINERALS) TABS tablet Take 1 tablet by mouth daily.      No current facility-administered medications for this encounter.    No Known Allergies  Social History    Socioeconomic History  . Marital status: Single    Spouse name: Not on file  . Number of children: Not on file  . Years of education: Not on file  . Highest education level: Not on file  Occupational History  . Not on file  Tobacco Use  . Smoking status: Never Smoker  . Smokeless tobacco: Never Used  Vaping Use  . Vaping Use: Never used  Substance and Sexual Activity  . Alcohol use: Not Currently  . Drug use: No  . Sexual activity: Not on file  Other Topics Concern  . Not on file  Social History Narrative  . Not on file   Social Determinants of Health   Financial Resource Strain:   . Difficulty of Paying Living Expenses:   Food Insecurity:   . Worried About Programme researcher, broadcasting/film/video in the Last Year:   . Barista in the Last Year:   Transportation Needs:   . Freight forwarder (Medical):   Marland Kitchen Lack of Transportation (Non-Medical):   Physical Activity:   . Days of Exercise per Week:   . Minutes of Exercise per Session:   Stress:   . Feeling of Stress :   Social Connections:   . Frequency of Communication with Friends and Family:   . Frequency of Social Gatherings with Friends and Family:   . Attends Religious Services:   . Active Member of Clubs or Organizations:   . Attends Banker Meetings:   Marland Kitchen Marital Status:   Intimate Partner Violence:   . Fear of Current or Ex-Partner:   . Emotionally Abused:   Marland Kitchen Physically Abused:   . Sexually Abused:     Family History  Problem Relation Age of Onset  . Atrial fibrillation Paternal Grandmother     ROS- All systems are reviewed and negative except as per the HPI above  Physical Exam: There were no vitals filed for this visit. Wt Readings from Last 3 Encounters:  11/29/19 64 kg  10/22/18 62.6 kg  09/02/18 63.5 kg    Labs: Lab Results  Component Value Date   NA 138 09/02/2018   K 3.4 (L) 09/02/2018   CL 103 09/02/2018   CO2 23 09/02/2018   GLUCOSE 163 (H) 09/02/2018   BUN 26 (H)  09/02/2018   CREATININE 0.82 09/02/2018   CALCIUM 9.9 09/02/2018   MG 2.2 06/12/2017   No results found for: INR No results found for: CHOL, HDL, LDLCALC, TRIG   GEN- The patient is well appearing, alert and oriented x 3 today.   Head- normocephalic, atraumatic Eyes-  Sclera clear, conjunctiva pink Ears- hearing intact Oropharynx- clear Neck- supple, no JVP Lymph- no cervical lymphadenopathy Lungs- Clear to ausculation bilaterally, normal work of breathing Heart- regular rate and rhythm, no murmurs, rubs or gallops, PMI not laterally displaced GI- soft, NT, ND, + BS Extremities- no clubbing, cyanosis, or edema MS- no significant deformity or atrophy Skin- no rash or lesion Psych- euthymic mood, full affect  Neuro- strength and sensation are intact  EKG-Sinus brady at 56 bpm, pr int 196 ms,  qrs int 104 ms, qtc 407 ms Ekg from urgent care also reviewed Echo Study Conclusions  - Left ventricle: The cavity size was normal. Systolic function was   normal. The estimated ejection fraction was in the range of 55%   to 60%. Wall motion was normal; there were no regional wall   motion abnormalities. - Aortic valve: Transvalvular velocity was within the normal range.   There was no stenosis. There was no regurgitation. - Mitral valve: Thickening. , consistent with rheumatic disease.   Transvalvular velocity was within the normal range. There was no   evidence for stenosis. There was mild regurgitation. - Right ventricle: The cavity size was normal. Wall thickness was   normal. Systolic function was normal. - Tricuspid valve: There was trivial regurgitation. - Pulmonary arteries: Systolic pressure was within the normal   range.    Assessment and Plan: 1.Afib  Staying in SR  Continue metoprolol succinate 25 mg at 12.5 mg at hs  Off xarelto for chadsvasc score of 0 Continue flecainide at 100 mg bid  If afib returns  consider ablation  F/u in 6 months  Lupita Leash C. [facility] [address] [place], [place] [address] [phone]

## 2020-06-05 ENCOUNTER — Ambulatory Visit (HOSPITAL_COMMUNITY): Payer: Self-pay | Admitting: Nurse Practitioner

## 2020-06-07 ENCOUNTER — Other Ambulatory Visit (HOSPITAL_COMMUNITY): Payer: Self-pay | Admitting: Physician Assistant

## 2020-07-16 ENCOUNTER — Ambulatory Visit (HOSPITAL_COMMUNITY): Payer: Self-pay | Admitting: Nurse Practitioner

## 2020-12-03 ENCOUNTER — Ambulatory Visit (HOSPITAL_COMMUNITY): Payer: Self-pay | Admitting: Nurse Practitioner

## 2020-12-16 ENCOUNTER — Ambulatory Visit (HOSPITAL_COMMUNITY)
Admission: RE | Admit: 2020-12-16 | Discharge: 2020-12-16 | Disposition: A | Discharge status: Home / Self Care | Payer: Self-pay | Source: Ambulatory Visit | Attending: Nurse Practitioner | Admitting: Nurse Practitioner

## 2020-12-16 ENCOUNTER — Other Ambulatory Visit: Payer: Self-pay

## 2020-12-16 ENCOUNTER — Encounter (HOSPITAL_COMMUNITY): Payer: Self-pay | Admitting: Nurse Practitioner

## 2020-12-16 VITALS — BP 106/66 | HR 60 | Ht 70.0 in | Wt 146.0 lb

## 2020-12-16 DIAGNOSIS — D6869 Other thrombophilia: Secondary | ICD-10-CM

## 2020-12-16 DIAGNOSIS — Z7901 Long term (current) use of anticoagulants: Secondary | ICD-10-CM | POA: Insufficient documentation

## 2020-12-16 DIAGNOSIS — I480 Paroxysmal atrial fibrillation: Secondary | ICD-10-CM

## 2020-12-16 DIAGNOSIS — I4819 Other persistent atrial fibrillation: Secondary | ICD-10-CM | POA: Insufficient documentation

## 2020-12-16 DIAGNOSIS — Z79899 Other long term (current) drug therapy: Secondary | ICD-10-CM | POA: Insufficient documentation

## 2020-12-16 DIAGNOSIS — Z8249 Family history of ischemic heart disease and other diseases of the circulatory system: Secondary | ICD-10-CM | POA: Insufficient documentation

## 2020-12-16 MED ORDER — METOPROLOL SUCCINATE ER 25 MG PO TB24: 12.5000 mg | ORAL_TABLET | Freq: Every day | ORAL | 3 refills | Status: DC

## 2020-12-16 MED ORDER — FLECAINIDE ACETATE 100 MG PO TABS: 100.0000 mg | ORAL_TABLET | Freq: Two times a day (BID) | ORAL | 9 refills | Status: DC

## 2020-12-16 NOTE — Progress Notes (Signed)
Primary Care Physician: Patient, No Pcp Per Referring Physician: Dr.  Ebony Cargo is a 30 y.o. male with a h/o SVT, followed by Dr. Elease Hashimoto, that was seen in an urgent care clinic 05/2017, for palpitations, found to be in rate controlled afib. He denied any use of caffeine, alcohol, drugs, no sleep apnea.   He felt he was out of rhythm for a week before he went to urgent care. He had EKG with him that very clearly documents afib. He was in rate controlled afib, when first seen in afib clinic 05/29/2017, HR in the 90's. 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 Melburn Popper started xarelto 20 mg a day on 05/29/17 and restarted metoprolol succinate 25 mg 1/2 tab a day.  On last visit, we tried to increase toprol to 25 mg 1/2 bid, to help restore SR,but he did not tolerate due to fatigue.  Unfortunately,f/u in afib clinic 06/12/17, he had not converted to SR with addtion of BB.   Successful  cardioversion 8/30. Marland KitchenStill no know triggers. Repeat echo showed rheumatic appearing valve, stable compared to last echo. He denies having rheumatic fever.  F/u in afib clinic 02/20/18. He went back into afib yesterday around 5 pm, while playing kick ball with his youth group.He has taken 12.5 mg metoprolol this am. He is rate controlled. He came off anticoagulation/BB last year after 4 weeks following cardioversion. Drank a couple of beers Friday Pm but still no significant amounts of alcohol. He restarted xarelto 20 mg daily within 24 hours of returning to afib.  F/u in afib clinic, 5/6. He persistens in afib. He is taking 12.5 mg bid metoprolol succinate and is rate controlled but makes him feel fatigued. We will plan for cardioversion and he will reduce to one half tab daily on cardioversion day.  F/u in afib clinic 12/30. He is now on flecainide 100 mg bid and is staying in SR. He feels well. No issues to report. No afib noted.   F/u in afib clinic, 11/29/19. Pt has moved to [place]  but he still chooses to come here intermittently for f/u of his afib. He reports that his flecainide is still doing a good job controlling his afib. He notices very few palpitations and usually just lasts  a few minutes.   F/u afib clinic, 12/16/20. He continues to feel well. No afib to report. He still lives in [place] but does not want to change MD. He has family here that he visits on clinic appointments. . He is still happy on flecainide and does not want an ablation yet. Possibly  in a year or two.   Today, he denies symptoms of palpitations, chest pain, shortness of breath, orthopnea, PND, lower extremity edema, dizziness, presyncope, syncope, or neurologic sequela. + for fatigue.The patient is tolerating medications without difficulties and is otherwise without complaint today.   Past Medical History:  Diagnosis Date  . Anxiety   . Persistent atrial fibrillation [facility])    Past Surgical History:  Procedure Laterality Date  . CARDIOVERSION N/A 06/22/2017   Procedure: CARDIOVERSION;  Surgeon: Elease Hashimoto Deloris Ping, MD;  Location: [facility] ENDOSCOPY;  Service: Cardiovascular;  Laterality: N/A;  . CARDIOVERSION N/A 03/02/2018   Procedure: CARDIOVERSION;  Surgeon: Lars Masson, MD;  Location: [facility] ENDOSCOPY;  Service: Cardiovascular;  Laterality: N/A;    Current Outpatient Medications  Medication Sig Dispense Refill  . Multiple Vitamin (MULTIVITAMIN WITH MINERALS) TABS tablet Take 1 tablet  by mouth daily.     . flecainide (TAMBOCOR) 100 MG tablet Take 1 tablet (100 mg total) by mouth 2 (two) times daily. 60 tablet 9  . metoprolol succinate (TOPROL-XL) 25 MG 24 hr tablet Take 0.5 tablets (12.5 mg total) by mouth at bedtime. 45 tablet 3   No current facility-administered medications for this encounter.    No Known Allergies  Social History   Socioeconomic History  . Marital status: Single    Spouse name: Not on file  . Number of children: Not on file  . Years of education: Not on file  .  Highest education level: Not on file  Occupational History  . Not on file  Tobacco Use  . Smoking status: Never Smoker  . Smokeless tobacco: Never Used  Vaping Use  . Vaping Use: Never used  Substance and Sexual Activity  . Alcohol use: Yes    Alcohol/week: 1.0 - 2.0 standard drink    Types: 1 - 2 Cans of beer per week  . Drug use: No  . Sexual activity: Not on file  Other Topics Concern  . Not on file  Social History Narrative  . Not on file   Social Determinants of Health   Financial Resource Strain: Not on file  Food Insecurity: Not on file  Transportation Needs: Not on file  Physical Activity: Not on file  Stress: Not on file  Social Connections: Not on file  Intimate Partner Violence: Not on file    Family History  Problem Relation Age of Onset  . Atrial fibrillation Paternal Grandmother     ROS- All systems are reviewed and negative except as per the HPI above  Physical Exam: Vitals:   12/16/20 1442  BP: 106/66  Pulse: 60  Weight: 66.2 kg  Height: 5\' 10"  (1.778 m)   Wt Readings from Last 3 Encounters:  12/16/20 66.2 kg  06/04/20 65.7 kg  11/29/19 64 kg    Labs: Lab Results  Component Value Date   NA 138 09/02/2018   K 3.4 (L) 09/02/2018   CL 103 09/02/2018   CO2 23 09/02/2018   GLUCOSE 163 (H) 09/02/2018   BUN 26 (H) 09/02/2018   CREATININE 0.82 09/02/2018   CALCIUM 9.9 09/02/2018   MG 2.2 06/12/2017   No results found for: INR No results found for: CHOL, HDL, LDLCALC, TRIG   GEN- The patient is well appearing, alert and oriented x 3 today.   Head- normocephalic, atraumatic Eyes-  Sclera clear, conjunctiva pink Ears- hearing intact Oropharynx- clear Neck- supple, no JVP Lymph- no cervical lymphadenopathy Lungs- Clear to ausculation bilaterally, normal work of breathing Heart- regular rate and rhythm, no murmurs, rubs or gallops, PMI not laterally displaced GI- soft, NT, ND, + BS Extremities- no clubbing, cyanosis, or edema MS- no  significant deformity or atrophy Skin- no rash or lesion Psych- euthymic mood, full affect Neuro- strength and sensation are intact  EKG-Sinus rhythm at 60 bpm, pr int 168 ms, qrs int 104 ms, qtc 408 ms  Ekg from urgent care also reviewed Echo Study Conclusions  - Left ventricle: The cavity size was normal. Systolic function was   normal. The estimated ejection fraction was in the range of 55%   to 60%. Wall motion was normal; there were no regional wall   motion abnormalities. - Aortic valve: Transvalvular velocity was within the normal range.   There was no stenosis. There was no regurgitation. - Mitral valve: Thickening. , consistent with rheumatic disease.  Transvalvular velocity was within the normal range. There was no   evidence for stenosis. There was mild regurgitation. - Right ventricle: The cavity size was normal. Wall thickness was   normal. Systolic function was normal. - Tricuspid valve: There was trivial regurgitation. - Pulmonary arteries: Systolic pressure was within the normal   range.    Assessment and Plan: 1.Persistent afib  Staying in SR Tolerating flecainide 100 mg bid  well  Continue metoprolol succinate 25 mg at 12.5 mg at hs  Off xarelto for chadsvasc score of 0 If afib returns  consider ablation  F/u in 9  months  Lupita Leash C. [facility] [address] [place], [place] [address] [phone]

## 2021-02-08 ENCOUNTER — Other Ambulatory Visit (HOSPITAL_COMMUNITY): Payer: Self-pay | Admitting: Nurse Practitioner

## 2021-11-22 ENCOUNTER — Other Ambulatory Visit (HOSPITAL_COMMUNITY): Payer: Self-pay | Admitting: Nurse Practitioner

## 2021-12-21 ENCOUNTER — Encounter (HOSPITAL_COMMUNITY): Payer: Self-pay | Admitting: Nurse Practitioner

## 2021-12-21 ENCOUNTER — Other Ambulatory Visit: Payer: Self-pay

## 2021-12-21 ENCOUNTER — Ambulatory Visit (HOSPITAL_COMMUNITY)
Admission: RE | Admit: 2021-12-21 | Discharge: 2021-12-21 | Disposition: A | Discharge status: Home / Self Care | Payer: Self-pay | Source: Ambulatory Visit | Attending: Nurse Practitioner | Admitting: Nurse Practitioner

## 2021-12-21 VITALS — BP 118/76 | HR 60 | Ht 70.0 in | Wt 144.6 lb

## 2021-12-21 DIAGNOSIS — Z79899 Other long term (current) drug therapy: Secondary | ICD-10-CM | POA: Insufficient documentation

## 2021-12-21 DIAGNOSIS — I480 Paroxysmal atrial fibrillation: Secondary | ICD-10-CM

## 2021-12-21 DIAGNOSIS — I4819 Other persistent atrial fibrillation: Secondary | ICD-10-CM | POA: Insufficient documentation

## 2021-12-21 DIAGNOSIS — Z7901 Long term (current) use of anticoagulants: Secondary | ICD-10-CM | POA: Insufficient documentation

## 2021-12-21 LAB — BASIC METABOLIC PANEL
Anion gap: 6 (ref 5–15)
BUN: 13 mg/dL (ref 6–20)
CO2: 30 mmol/L (ref 22–32)
Calcium: 9.6 mg/dL (ref 8.9–10.3)
Chloride: 103 mmol/L (ref 98–111)
Creatinine, Ser: 1.08 mg/dL (ref 0.61–1.24)
GFR, Estimated: >60 mL/min (ref >60)
Glucose, Bld: 105 mg/dL — ABNORMAL HIGH (ref 70–99)
Potassium: 4.1 mmol/L (ref 3.5–5.1)
Sodium: 139 mmol/L (ref 135–145)

## 2021-12-21 LAB — CBC
HCT: 46 % (ref 39.0–52.0)
Hemoglobin: 15.8 g/dL (ref 13.0–17.0)
MCH: 31.5 pg (ref 26.0–34.0)
MCHC: 34.3 g/dL (ref 30.0–36.0)
MCV: 91.8 fL (ref 80.0–100.0)
Platelets: 179 10*3/uL (ref 150–400)
RBC: 5.01 MIL/uL (ref 4.22–5.81)
RDW: 11.7 % (ref 11.5–15.5)
WBC: 5.5 10*3/uL (ref 4.0–10.5)
nRBC: 0 % (ref 0.0–0.2)

## 2021-12-21 MED ORDER — FLECAINIDE ACETATE 100 MG PO TABS: 100.0000 mg | ORAL_TABLET | Freq: Two times a day (BID) | ORAL | 11 refills | Status: DC

## 2021-12-21 NOTE — Progress Notes (Signed)
Primary Care Physician: Patient, No Pcp Per (Inactive) Referring Physician: Dr.  Ebony Cargo is a 31 y.o. male with a h/o SVT, followed by Dr. Elease Hashimoto, that was seen in an urgent care clinic 05/2017, for palpitations, found to be in rate controlled afib. He denied any use of caffeine, alcohol, drugs, no sleep apnea.   He felt he was out of rhythm for a week before he went to urgent care. He had EKG with him that very clearly documents afib. He was in rate controlled afib, when first seen in afib clinic 05/29/2017, HR in the 90's. 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 Melburn Popper started xarelto 20 mg a day on 05/29/17 and restarted metoprolol succinate 25 mg 1/2 tab a day.  On last visit, we tried to increase toprol to 25 mg 1/2 bid, to help restore SR,but he did not tolerate due to fatigue.  Unfortunately,f/u in afib clinic 06/12/17, he had not converted to SR with addtion of BB.   Successful  cardioversion 8/30. Marland KitchenStill no know triggers. Repeat echo showed rheumatic appearing valve, stable compared to last echo. He denies having rheumatic fever.  F/u in afib clinic 02/20/18. He went back into afib yesterday around 5 pm, while playing kick ball with his youth group.He has taken 12.5 mg metoprolol this am. He is rate controlled. He came off anticoagulation/BB last year after 4 weeks following cardioversion. Drank a couple of beers Friday Pm but still no significant amounts of alcohol. He restarted xarelto 20 mg daily within 24 hours of returning to afib.  F/u in afib clinic, 5/6. He persistens in afib. He is taking 12.5 mg bid metoprolol succinate and is rate controlled but makes him feel fatigued. We will plan for cardioversion and he will reduce to one half tab daily on cardioversion day.  F/u in afib clinic 12/30. He is now on flecainide 100 mg bid and is staying in SR. He feels well. No issues to report. No afib noted.   F/u in afib clinic, 11/29/19. Pt has moved to  [place] but he still chooses to come here intermittently for f/u of his afib. He reports that his flecainide is still doing a good job controlling his afib. He notices very few palpitations and usually just lasts  a few minutes.   F/u afib clinic, 12/16/20. He continues to feel well. No afib to report. He still lives in [place] but does not want to change MD. He has family here that he visits on clinic appointments. . He is still happy on flecainide and does not want an ablation yet. Possibly  in a year or two.   F/u in afib clinic for one year f/u on flecainide, 12/21/81. He reports that he is doing well. No afib to report. He continues ot live in [place] and prefers to come here for his f/u.   Today, he denies symptoms of palpitations, chest pain, shortness of breath, orthopnea, PND, lower extremity edema, dizziness, presyncope, syncope, or neurologic sequela. + for fatigue.The patient is tolerating medications without difficulties and is otherwise without complaint today.   Past Medical History:  Diagnosis Date   Anxiety    Persistent atrial fibrillation [facility])    Past Surgical History:  Procedure Laterality Date   CARDIOVERSION N/A 06/22/2017   Procedure: CARDIOVERSION;  Surgeon: Nahser, Deloris Ping, MD;  Location[facility] ENDOSCOPY;  Service: Cardiovascular;  Laterality: N/A;   CARDIOVERSION N/A 03/02/2018   Procedure: CARDIOVERSION;  Surgeon: Lars Masson, MD;  Location: [facility] ENDOSCOPY;  Service: Cardiovascular;  Laterality: N/A;    Current Outpatient Medications  Medication Sig Dispense Refill   flecainide (TAMBOCOR) 100 MG tablet Take 1 tablet (100 mg total) by mouth 2 (two) times daily. appt req for refill [phone] 60 tablet 0   metoprolol succinate (TOPROL-XL) 25 MG 24 hr tablet Take 0.5 tablets (12.5 mg total) by mouth at bedtime. 45 tablet 3   Multiple Vitamin (MULTIVITAMIN WITH MINERALS) TABS tablet Take 1 tablet by mouth daily.      No current facility-administered  medications for this encounter.    No Known Allergies  Social History   Socioeconomic History   Marital status: Single    Spouse name: Not on file   Number of children: Not on file   Years of education: Not on file   Highest education level: Not on file  Occupational History   Not on file  Tobacco Use   Smoking status: Never   Smokeless tobacco: Never  Vaping Use   Vaping Use: Never used  Substance and Sexual Activity   Alcohol use: Yes    Alcohol/week: 1.0 - 2.0 standard drink    Types: 1 - 2 Cans of beer per week   Drug use: No   Sexual activity: Not on file  Other Topics Concern   Not on file  Social History Narrative   Not on file   Social Determinants of Health   Financial Resource Strain: Not on file  Food Insecurity: Not on file  Transportation Needs: Not on file  Physical Activity: Not on file  Stress: Not on file  Social Connections: Not on file  Intimate Partner Violence: Not on file    Family History  Problem Relation Age of Onset   Atrial fibrillation Paternal Grandmother     ROS- All systems are reviewed and negative except as per the HPI above  Physical Exam: There were no vitals filed for this visit.  Wt Readings from Last 3 Encounters:  12/16/20 66.2 kg  06/04/20 65.7 kg  11/29/19 64 kg    Labs: Lab Results  Component Value Date   NA 138 09/02/2018   K 3.4 (L) 09/02/2018   CL 103 09/02/2018   CO2 23 09/02/2018   GLUCOSE 163 (H) 09/02/2018   BUN 26 (H) 09/02/2018   CREATININE 0.82 09/02/2018   CALCIUM 9.9 09/02/2018   MG 2.2 06/12/2017   No results found for: INR No results found for: CHOL, HDL, LDLCALC, TRIG   GEN- The patient is well appearing, alert and oriented x 3 today.   Head- normocephalic, atraumatic Eyes-  Sclera clear, conjunctiva pink Ears- hearing intact Oropharynx- clear Neck- supple, no JVP Lymph- no cervical lymphadenopathy Lungs- Clear to ausculation bilaterally, normal work of breathing Heart- regular  rate and rhythm, no murmurs, rubs or gallops, PMI not laterally displaced GI- soft, NT, ND, + BS Extremities- no clubbing, cyanosis, or edema MS- no significant deformity or atrophy Skin- no rash or lesion Psych- euthymic mood, full affect Neuro- strength and sensation are intact  EKG-Vent. rate 60 BPM PR interval 172 ms QRS duration 102 ms QT/QTcB 406/406 ms P-R-T axes 76 94 66 Normal sinus rhythm Rightward axis Borderline ECG When compared with ECG of 16-Dec-2020 14:44, PREVIOUS ECG IS PRESENT Echo Study Conclusions   - Left ventricle: The cavity size was normal. Systolic function was   normal. The estimated ejection fraction was in the range of 55%   to 60%.  Wall motion was normal; there were no regional wall   motion abnormalities. - Aortic valve: Transvalvular velocity was within the normal range.   There was no stenosis. There was no regurgitation. - Mitral valve: Thickening. , consistent with rheumatic disease.   Transvalvular velocity was within the normal range. There was no   evidence for stenosis. There was mild regurgitation. - Right ventricle: The cavity size was normal. Wall thickness was   normal. Systolic function was normal. - Tricuspid valve: There was trivial regurgitation. - Pulmonary arteries: Systolic pressure was within the normal   range.    Assessment and Plan: 1.Persistent afib  Staying in SR Tolerating flecainide 100 mg bid  and staying in SR. Continue metoprolol succinate 25 mg at 12.5 mg at hs  Off xarelto for chadsvasc score of 0 If afib returns, can  consider ablation  F/u in 1 year  Lupita Leash C. Matthew Folks [facility] [address] [place], [place] [address] [phone]

## 2021-12-22 ENCOUNTER — Other Ambulatory Visit (HOSPITAL_COMMUNITY): Payer: Self-pay | Admitting: Nurse Practitioner

## 2022-12-01 ENCOUNTER — Encounter (HOSPITAL_COMMUNITY): Payer: Self-pay | Admitting: *Deleted

## 2022-12-19 ENCOUNTER — Other Ambulatory Visit (HOSPITAL_COMMUNITY): Payer: Self-pay | Admitting: Nurse Practitioner

## 2022-12-20 ENCOUNTER — Other Ambulatory Visit (HOSPITAL_COMMUNITY): Payer: Self-pay | Admitting: *Deleted

## 2022-12-20 MED ORDER — METOPROLOL SUCCINATE ER 25 MG PO TB24: ORAL_TABLET | ORAL | 0 refills | Status: DC

## 2023-01-04 ENCOUNTER — Other Ambulatory Visit (HOSPITAL_COMMUNITY): Payer: Self-pay | Admitting: Nurse Practitioner

## 2023-01-13 ENCOUNTER — Ambulatory Visit (HOSPITAL_COMMUNITY): Payer: Self-pay | Admitting: Physician Assistant

## 2023-01-27 ENCOUNTER — Ambulatory Visit (HOSPITAL_COMMUNITY): Payer: Self-pay | Admitting: Physician Assistant

## 2023-02-08 ENCOUNTER — Ambulatory Visit (HOSPITAL_COMMUNITY)
Admission: RE | Admit: 2023-02-08 | Discharge: 2023-02-08 | Disposition: A | Discharge status: Home / Self Care | Payer: Self-pay | Source: Ambulatory Visit | Attending: Physician Assistant | Admitting: Physician Assistant

## 2023-02-08 ENCOUNTER — Encounter (HOSPITAL_COMMUNITY): Payer: Self-pay | Admitting: Internal Medicine

## 2023-02-08 VITALS — BP 104/70 | HR 74 | Ht 70.0 in | Wt 141.6 lb

## 2023-02-08 DIAGNOSIS — Z79899 Other long term (current) drug therapy: Secondary | ICD-10-CM | POA: Insufficient documentation

## 2023-02-08 DIAGNOSIS — Z7901 Long term (current) use of anticoagulants: Secondary | ICD-10-CM | POA: Insufficient documentation

## 2023-02-08 DIAGNOSIS — I4819 Other persistent atrial fibrillation: Secondary | ICD-10-CM | POA: Insufficient documentation

## 2023-02-08 DIAGNOSIS — Z5181 Encounter for therapeutic drug level monitoring: Secondary | ICD-10-CM

## 2023-02-08 MED ORDER — METOPROLOL SUCCINATE ER 25 MG PO TB24: ORAL_TABLET | ORAL | 2 refills | Status: DC

## 2023-02-08 MED ORDER — FLECAINIDE ACETATE 100 MG PO TABS: 100.0000 mg | ORAL_TABLET | Freq: Two times a day (BID) | ORAL | 2 refills | Status: DC

## 2023-02-08 NOTE — Progress Notes (Signed)
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 Elease Hashimoto, that was seen in an urgent care clinic 05/2017, for palpitations, found to be in rate controlled afib. He denied any use of caffeine, alcohol, drugs, no sleep apnea.   He felt he was out of rhythm for a week before he went to urgent care. He had EKG with him that very clearly documents afib. He was in rate controlled afib, when first seen in afib clinic 05/29/2017, HR in the 90's. 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 Melburn Popper started xarelto 20 mg a day on 05/29/17 and restarted metoprolol succinate 25 mg 1/2 tab a day.  On last visit, we tried to increase toprol to 25 mg 1/2 bid, to help restore SR,but he did not tolerate due to fatigue.  Unfortunately,f/u in afib clinic 06/12/17, he had not converted to SR with addtion of BB.   Successful  cardioversion 8/30. Marland KitchenStill no know triggers. Repeat echo showed rheumatic appearing valve, stable compared to last echo. He denies having rheumatic fever.  F/u in afib clinic 02/20/18. He went back into afib yesterday around 5 pm, while playing kick ball with his youth group.He has taken 12.5 mg metoprolol this am. He is rate controlled. He came off anticoagulation/BB last year after 4 weeks following cardioversion. Drank a couple of beers Friday Pm but still no significant amounts of alcohol. He restarted xarelto 20 mg daily within 24 hours of returning to afib.  F/u in afib clinic, 5/6. He persistens in afib. He is taking 12.5 mg bid metoprolol succinate and is rate controlled but makes him feel fatigued. We will plan for cardioversion and he will reduce to one half tab daily on cardioversion day.  F/u in afib clinic 12/30. He is now on flecainide 100 mg bid and is staying in SR. He feels well. No issues to report. No afib noted.   F/u in afib clinic, 11/29/19. Pt has moved to [place]  but he still chooses to come here intermittently for f/u of his afib. He reports that his flecainide is still doing a good job controlling his afib. He notices very few palpitations and usually just lasts  a few minutes.   F/u afib clinic, 12/16/20. He continues to feel well. No afib to report. He still lives in [place] but does not want to change MD. He has family here that he visits on clinic appointments. . He is still happy on flecainide and does not want an ablation yet. Possibly  in a year or two.   F/u in afib clinic for one year f/u on flecainide, 12/21/81. He reports that he is doing well. No afib to report. He continues to live in [place] and prefers to come here for his f/u.   Follow up in the AF clinic 02/08/23. Patient reports that he has done very well since his last visit. He does have some very brief "fluttering" but it does not last long.   Today, he denies symptoms of chest pain, shortness of breath, orthopnea, PND, lower extremity edema, dizziness, presyncope, syncope, or neurologic sequela. The patient is tolerating medications without difficulties and is otherwise without complaint today.   Past Medical History:  Diagnosis Date   Anxiety    Persistent atrial fibrillation    Past Surgical History:  Procedure Laterality Date   CARDIOVERSION N/A 06/22/2017  Procedure: CARDIOVERSION;  Surgeon: Elease Hashimoto Deloris Ping, MD;  Location[facility] ENDOSCOPY;  Service: Cardiovascular;  Laterality: N/A;   CARDIOVERSION N/A 03/02/2018   Procedure: CARDIOVERSION;  Surgeon: Lars Masson, MD;  Location: [facility] ENDOSCOPY;  Service: Cardiovascular;  Laterality: N/A;    Current Outpatient Medications  Medication Sig Dispense Refill   flecainide (TAMBOCOR) 100 MG tablet TAKE ONE TABLET BY MOUTH TWICE A DAY 60 tablet 1   metoprolol succinate (TOPROL-XL) 25 MG 24 hr tablet TAKE ONE-HALF OF A TABLET BY MOUTH EVERY NIGHT AT BEDTIME 45 tablet 0   Multiple Vitamin (MULTIVITAMIN WITH MINERALS) TABS  tablet Take 1 tablet by mouth daily.      No current facility-administered medications for this encounter.    No Known Allergies  Social History   Socioeconomic History   Marital status: Single    Spouse name: Not on file   Number of children: Not on file   Years of education: Not on file   Highest education level: Not on file  Occupational History   Not on file  Tobacco Use   Smoking status: Never   Smokeless tobacco: Never   Tobacco comments:    Never smoke 02/08/23  Vaping Use   Vaping Use: Never used  Substance and Sexual Activity   Alcohol use: Yes    Alcohol/week: 1.0 - 2.0 standard drink of alcohol    Types: 1 - 2 Cans of beer per week   Drug use: No   Sexual activity: Not on file  Other Topics Concern   Not on file  Social History Narrative   Not on file   Social Determinants of Health   Financial Resource Strain: Not on file  Food Insecurity: Not on file  Transportation Needs: Not on file  Physical Activity: Not on file  Stress: Not on file  Social Connections: Not on file  Intimate Partner Violence: Not on file    Family History  Problem Relation Age of Onset   Atrial fibrillation Paternal Grandmother     ROS- All systems are reviewed and negative except as per the HPI above  Physical Exam: Vitals:   02/08/23 1516  BP: 104/70  Pulse: 74  Weight: 64.2 kg  Height: 5\' 10"  (1.778 m)    Wt Readings from Last 3 Encounters:  02/08/23 64.2 kg  12/21/21 65.6 kg  12/16/20 66.2 kg    Labs: Lab Results  Component Value Date   NA 139 12/21/2021   K 4.1 12/21/2021   CL 103 12/21/2021   CO2 30 12/21/2021   GLUCOSE 105 (H) 12/21/2021   BUN 13 12/21/2021   CREATININE 1.08 12/21/2021   CALCIUM 9.6 12/21/2021   MG 2.2 06/12/2017     GEN- The patient is a well appearing male, alert and oriented x 3 today.   HEENT-head normocephalic, atraumatic, sclera clear, conjunctiva pink, hearing intact, trachea midline. Lungs- Clear to ausculation  bilaterally, normal work of breathing Heart- Regular rate and rhythm, no murmurs, rubs or gallops  GI- soft, NT, ND, + BS Extremities- no clubbing, cyanosis, or edema MS- no significant deformity or atrophy Skin- no rash or lesion Psych- euthymic mood, full affect Neuro- strength and sensation are intact   EKG today demonstrates SR Vent. rate 74 BPM PR interval 162 ms QRS duration 96 ms QT/QTcB 370/410 ms   Echo 06/06/17  - Left ventricle: The cavity size was normal. Systolic function was   normal. The estimated ejection fraction was in the range of 55%  to 60%. Wall motion was normal; there were no regional wall   motion abnormalities. - Aortic valve: Transvalvular velocity was within the normal range.   There was no stenosis. There was no regurgitation. - Mitral valve: Thickening. , consistent with rheumatic disease.   Transvalvular velocity was within the normal range. There was no   evidence for stenosis. There was mild regurgitation. - Right ventricle: The cavity size was normal. Wall thickness was   normal. Systolic function was normal. - Tricuspid valve: There was trivial regurgitation. - Pulmonary arteries: Systolic pressure was within the normal   range.   CHA2DS2-VASc Score = 0  The patient's score is based upon: CHF History: 0 HTN History: 0 Diabetes History: 0 Stroke History: 0 Vascular Disease History: 0 Age Score: 0 Gender Score: 0       ASSESSMENT AND PLAN: 1. Persistent Atrial Fibrillation (ICD10:  I48.19) The patient's CHA2DS2-VASc score is 0, indicating a 0.2% annual risk of stroke. Patient appears to be maintaining SR.  Continue flecainide 100 mg BID Continue metoprolol succinate 12.5 mg daily  Not currently on anticoagulation with low CV score.  We discussed ablation in order to come off of AAD, he is willing to consider.   Follow up with EP to establish care in 6 months. AF clinic in one year.    Jorja Loa PA-C Afib Clinic [facility] [address] [place], [place] [address] [phone]

## 2023-03-22 ENCOUNTER — Other Ambulatory Visit (HOSPITAL_COMMUNITY): Payer: Self-pay | Admitting: *Deleted

## 2023-03-22 MED ORDER — METOPROLOL SUCCINATE ER 25 MG PO TB24: ORAL_TABLET | ORAL | 3 refills | Status: DC

## 2023-08-14 ENCOUNTER — Ambulatory Visit: Payer: Self-pay | Admitting: Cardiovascular Disease

## 2023-08-21 ENCOUNTER — Encounter: Payer: Self-pay | Admitting: General Practice

## 2023-08-25 ENCOUNTER — Other Ambulatory Visit (HOSPITAL_COMMUNITY): Payer: Self-pay | Admitting: *Deleted

## 2023-08-25 MED ORDER — FLECAINIDE ACETATE 100 MG PO TABS: 100.0000 mg | ORAL_TABLET | Freq: Two times a day (BID) | ORAL | 2 refills | Status: DC

## 2023-08-25 MED ORDER — METOPROLOL SUCCINATE ER 25 MG PO TB24: ORAL_TABLET | ORAL | 3 refills | Status: DC

## 2024-04-18 ENCOUNTER — Other Ambulatory Visit (HOSPITAL_COMMUNITY): Payer: Self-pay | Admitting: *Deleted

## 2024-04-18 MED ORDER — FLECAINIDE ACETATE 100 MG PO TABS: 100.0000 mg | ORAL_TABLET | Freq: Two times a day (BID) | ORAL | 0 refills | Status: DC

## 2024-04-18 MED ORDER — METOPROLOL SUCCINATE ER 25 MG PO TB24: ORAL_TABLET | ORAL | 0 refills | Status: DC

## 2024-06-12 ENCOUNTER — Encounter: Payer: Self-pay | Admitting: Emergency Medicine

## 2024-07-14 ENCOUNTER — Other Ambulatory Visit (HOSPITAL_COMMUNITY): Payer: Self-pay | Admitting: Physician Assistant

## 2024-08-08 ENCOUNTER — Other Ambulatory Visit (HOSPITAL_COMMUNITY): Payer: Self-pay | Admitting: Physician Assistant

## 2024-08-09 ENCOUNTER — Encounter (HOSPITAL_COMMUNITY): Payer: Self-pay

## 2024-08-12 ENCOUNTER — Other Ambulatory Visit (HOSPITAL_COMMUNITY): Payer: Self-pay | Admitting: Physician Assistant

## 2024-09-07 ENCOUNTER — Other Ambulatory Visit (HOSPITAL_COMMUNITY): Payer: Self-pay | Admitting: Physician Assistant

## 2024-10-06 ENCOUNTER — Other Ambulatory Visit (HOSPITAL_COMMUNITY): Payer: Self-pay | Admitting: Physician Assistant
